# Patient Record
Sex: Male | Born: 1990 | Race: White | Hispanic: No | Marital: Married | Smoking: Former smoker
Health system: Southern US, Community
[De-identification: ages and names within clinical notes are randomized; demographics above are authoritative.]

## PROBLEM LIST (undated history)

## (undated) DIAGNOSIS — F988 Other specified behavioral and emotional disorders with onset usually occurring in childhood and adolescence: Secondary | ICD-10-CM

## (undated) DIAGNOSIS — M1990 Unspecified osteoarthritis, unspecified site: Secondary | ICD-10-CM

## (undated) DIAGNOSIS — G039 Meningitis, unspecified: Secondary | ICD-10-CM

## (undated) DIAGNOSIS — Z8489 Family history of other specified conditions: Secondary | ICD-10-CM

## (undated) DIAGNOSIS — K219 Gastro-esophageal reflux disease without esophagitis: Secondary | ICD-10-CM

## (undated) DIAGNOSIS — Z8614 Personal history of Methicillin resistant Staphylococcus aureus infection: Secondary | ICD-10-CM

## (undated) HISTORY — DX: Other specified behavioral and emotional disorders with onset usually occurring in childhood and adolescence: F98.8

## (undated) HISTORY — PX: TONSILLECTOMY: SUR1361

## (undated) HISTORY — DX: Meningitis, unspecified: G03.9

---
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
## 2008-10-09 ENCOUNTER — Telehealth (INDEPENDENT_AMBULATORY_CARE_PROVIDER_SITE_OTHER): Payer: Self-pay | Admitting: *Deleted

## 2008-10-09 ENCOUNTER — Ambulatory Visit: Payer: Self-pay | Admitting: Family Medicine

## 2008-10-09 DIAGNOSIS — F988 Other specified behavioral and emotional disorders with onset usually occurring in childhood and adolescence: Secondary | ICD-10-CM | POA: Insufficient documentation

## 2008-10-26 LAB — CONVERTED CEMR LAB
ALT: 35 units/L (ref 0–53)
AST: 23 units/L (ref 0–37)
Albumin: 4 g/dL (ref 3.5–5.2)
Alkaline Phosphatase: 104 units/L (ref 39–117)
BUN: 14 mg/dL (ref 6–23)
Bilirubin, Direct: 0.1 mg/dL (ref 0.0–0.3)
CO2: 28 meq/L (ref 19–32)
Chloride: 106 meq/L (ref 96–112)
Cholesterol: 158 mg/dL (ref 0–200)
Eosinophils Relative: 4.7 % (ref 0.0–5.0)
GFR calc Af Amer: 160 mL/min
Glucose, Bld: 96 mg/dL (ref 70–99)
HCT: 44.9 % (ref 39.0–52.0)
Lymphocytes Relative: 26.6 % (ref 12.0–46.0)
Monocytes Absolute: 0.7 10*3/uL (ref 0.1–1.0)
Monocytes Relative: 9.1 % (ref 3.0–12.0)
Neutrophils Relative %: 59.5 % (ref 43.0–77.0)
Platelets: 218 10*3/uL (ref 150–400)
Potassium: 3.7 meq/L (ref 3.5–5.1)
RDW: 12.2 % (ref 11.5–14.6)
Sodium: 142 meq/L (ref 135–145)
Total CHOL/HDL Ratio: 5.9
Total Protein: 6.7 g/dL (ref 6.0–8.3)
Triglycerides: 211 mg/dL (ref 0–149)
VLDL: 42 mg/dL — ABNORMAL HIGH (ref 0–40)
WBC: 8.1 10*3/uL (ref 4.5–10.5)

## 2010-07-31 DIAGNOSIS — Z8614 Personal history of Methicillin resistant Staphylococcus aureus infection: Secondary | ICD-10-CM

## 2010-07-31 HISTORY — DX: Personal history of Methicillin resistant Staphylococcus aureus infection: Z86.14

## 2011-02-06 ENCOUNTER — Encounter: Payer: Self-pay | Admitting: Family Medicine

## 2011-02-07 ENCOUNTER — Ambulatory Visit (INDEPENDENT_AMBULATORY_CARE_PROVIDER_SITE_OTHER): Payer: PRIVATE HEALTH INSURANCE | Admitting: Family Medicine

## 2011-02-07 ENCOUNTER — Encounter: Payer: Self-pay | Admitting: Family Medicine

## 2011-02-07 VITALS — BP 126/76 | HR 76 | Temp 99.0°F | Wt 311.6 lb

## 2011-02-07 DIAGNOSIS — F988 Other specified behavioral and emotional disorders with onset usually occurring in childhood and adolescence: Secondary | ICD-10-CM

## 2011-02-07 MED ORDER — AMPHETAMINE-DEXTROAMPHETAMINE 10 MG PO TABS: 10.0000 mg | ORAL_TABLET | Freq: Two times a day (BID) | ORAL | Status: DC

## 2011-02-07 NOTE — Patient Instructions (Signed)
Attention Deficit-Hyperactivity Disorder ADHD Attention deficit-hyperactivity disorder (ADHD) is a problem with behavior issues based on the way the brain functions (neurobehavioral disorder). It is a common reason for behavior and academic problems in school. CAUSES The cause of ADHD is unknown in most cases. It may run in families. It sometimes can be associated with learning disabilities and other behavioral problems. SYMPTOMS There are three types of ADHD. Some of the symptoms include:  Inattentive   Gets bored or distracted easily   Loses or forgets things. Forgets to hand in homework.   Has trouble organizing or completing tasks.   Difficulty staying on task.   An inability to organize daily tasks and school work.   Leaving projects, chores and homework unfinished.   Trouble paying attention or responding to details. Careless mistakes.   Difficulty following directions. Often seems like is not listening.   Dislikes activities that require sustained attention (like chores or homework).   Hyperactive-impulsive   Feels like it is impossible to sit still or stay in a seat. Fidgeting with hands and feet.   Trouble waiting turn.   Talking too much or out of turn. Interruptive.   Speaks or acts impulsively   Aggressive, disruptive behavior   Constantly busy or on the go, noisy.   Combined   Has symptoms of both of the above.  Often children with ADHD feel discouraged about themselves and with school. They often perform well below their abilities in school. These symptoms can cause problems in home, school, and in relationships with peers. As children get older, the excess motor activities can calm down, but the problems with paying attention and staying organized persist. Most children do not outgrow ADHD but with good treatment can learn to cope with the symptoms. DIAGNOSIS When ADHD is suspected, the diagnosis should be made by professionals trained in ADHD.    Diagnosis will include:  Ruling out other reasons for the child's behavior.   The caregivers will check with the child's school and check their medical records.   They will talk to teachers and parents.   Behavior rating scales for the child will be filled out by those dealing with the child on a daily basis.  A diagnosis is made only after all information has been considered. TREATMENT Treatment usually includes behavioral treatment often along with medicines. It may include stimulant medicines. The stimulant medicines decrease impulsivity and hyperactivity and increase attention. Other medicines used include antidepressants and certain blood pressure medicines. Most experts agree that treatment for ADHD should address all aspects of the child's functioning. Treatment should not be limited to the use of medicines alone. Treatment should include structured classroom management. The parents must receive education to address rewarding good behavior, discipline and limit-setting. Tutoring and/or behavioral therapy should be available for the child. If untreated, the disorder can have long term serious effects into adolescence and adulthood. HOMECARE INSTRUCTIONS   Often with ADHD there is a lot of frustration among the family in dealing with the illness. There is often blame and anger that is not warranted. This is a life long illness. There is no way to prevent ADHD. In many cases, because the problem affects the family as a whole, the entire family may need help. A therapist can help the family find better ways to handle the disruptive behaviors and promote change. If the child is young, most of the therapist's work is with the parents. Parents will learn techniques for coping with and improving their child's behavior.   Sometimes only the child with the ADHD needs counseling. Your caregivers can help you make these decisions.   Children with ADHD may need help in organizing. Here are some helpful  tips:   Keep routines the same every day from wake-up time to bedtime. Schedule everything. This includes homework and playtime. This should include outdoor and indoor recreation. Keep the schedule on the refrigerator or a bulletin board where it is frequently seen. Mark schedule changes as far in advance as possible.   Have a place for everything and keep everything in its place. This includes clothing, backpacks, and school supplies.   Encourage writing down assignments and bringing home needed books.   Offer your child a well-balanced diet. Breakfast is especially important for school performance. Children should avoid drinks with caffeine including:   Soft drinks.   Coffee.   Tea.   However, some older children (adolescents) may find these drinks helpful in improving their attention.   Children with ADHD need consistent rules that they can understand and follow. If rules are followed, give small rewards. Children with ADHD often receive, and expect, criticism. Look for good behavior and praise it. Set realistic goals. Give clear instructions. Look for activities that can foster success and self-esteem. Make time for pleasant activities with your child. Give lots of affection.   Parents are their children's greatest advocates. Learn as much as possible about ADHD. This helps you become a stronger and better advocate for your child. It also helps you educate your child's teachers and instructors if they feel inadequate in these areas. Parent support groups are often helpful. A national group with local chapters is called CHADD (Children and Adults with Attention Deficit/Hyperactivity Disorder).  PROGNOSIS  There is no cure for ADHD. Children with the disorder seldom outgrow it. Many find adaptive ways to accommodate the ADHD as they mature. SEEK MEDICAL CARE IF YOUR CHILD HAS:  Repeated muscle twitches, cough or speech outbursts.   Sleep problems.   Marked loss of appetite.    Depression.   New or worsening behavioral problems.   Dizziness.   Racing heart.   Stomach pains.   Headaches.  Document Released: 07/07/2002 Document Re-Released: 04/25/2008 [employer] Patient Information 2011 [employer]. 

## 2011-02-07 NOTE — Progress Notes (Signed)
  Subjective:    Patient ID: Nicholas Cook, male    DOB: 06-14-1991, 20 y.o.   MRN: [id number]  HPI  Pt here for ADD f/u.  No complaints--doing very well in school with the adderall.     Review of Systems As above    Objective:   Physical Exam  Constitutional: He appears well-developed and well-nourished.  Cardiovascular: Normal rate, regular rhythm, normal heart sounds and intact distal pulses.   Pulmonary/Chest: Effort normal and breath sounds normal.  Psychiatric: He has a normal mood and affect. His behavior is normal. Judgment and thought content normal.          Assessment & Plan:

## 2011-02-07 NOTE — Assessment & Plan Note (Signed)
Refill meds for 3 months rto 6 months

## 2011-03-09 ENCOUNTER — Other Ambulatory Visit: Payer: Self-pay | Admitting: Family Medicine

## 2011-03-10 NOTE — Telephone Encounter (Signed)
Spoke with pharmacy awaiting fax.

## 2011-03-17 NOTE — Telephone Encounter (Signed)
Several attempts have been made to contact number for prior auth #'s given requires you to leave a msg a message has been left twice for return call. [phone]

## 2011-03-27 NOTE — Telephone Encounter (Signed)
Spoke with PA agent who states we should receive call back within 48 hours.  Nicholas Cook and [employer] [phone]

## 2011-05-05 ENCOUNTER — Other Ambulatory Visit: Payer: Self-pay

## 2011-05-05 DIAGNOSIS — F988 Other specified behavioral and emotional disorders with onset usually occurring in childhood and adolescence: Secondary | ICD-10-CM

## 2011-05-05 MED ORDER — AMPHETAMINE-DEXTROAMPHETAMINE 10 MG PO TABS: 10.0000 mg | ORAL_TABLET | Freq: Two times a day (BID) | ORAL | Status: DC

## 2011-05-05 NOTE — Telephone Encounter (Signed)
Call from mother Nicholas Cook for refill request on Adderall--- VM left advising Gaylyn Rong that Rx ready for pick up     KP

## 2011-05-15 NOTE — Telephone Encounter (Signed)
Spoke with PA agent who states that form has yet to be receive. Form re-faxed awaiting status.

## 2011-05-22 NOTE — Telephone Encounter (Deleted)
Prior auth Approved 03-29-11 until 07-31-11.       

## 2011-05-22 NOTE — Telephone Encounter (Signed)
Prior auth Approved 03-29-11 until 07-31-11.

## 2011-08-03 ENCOUNTER — Telehealth: Payer: Self-pay

## 2011-08-03 NOTE — Telephone Encounter (Signed)
Called for PA and  Was told the patient's insurance was expired and this is why he does not have coverage. I called patient to discuss and he stated he made pharmacy aware and there were not supposed to send the PA information to [place]. He stated he will be paying for his med out of pocket and he is getting a discount.    KP

## 2011-08-14 ENCOUNTER — Other Ambulatory Visit: Payer: Self-pay

## 2011-08-14 DIAGNOSIS — F988 Other specified behavioral and emotional disorders with onset usually occurring in childhood and adolescence: Secondary | ICD-10-CM

## 2011-08-14 MED ORDER — AMPHETAMINE-DEXTROAMPHETAMINE 10 MG PO TABS: 10.0000 mg | ORAL_TABLET | Freq: Two times a day (BID) | ORAL | Status: DC

## 2011-08-14 NOTE — Telephone Encounter (Signed)
Rx left at check in.      KP 

## 2011-09-25 ENCOUNTER — Other Ambulatory Visit: Payer: Self-pay

## 2011-09-25 DIAGNOSIS — F988 Other specified behavioral and emotional disorders with onset usually occurring in childhood and adolescence: Secondary | ICD-10-CM

## 2011-09-25 MED ORDER — AMPHETAMINE-DEXTROAMPHETAMINE 10 MG PO TABS: 10.0000 mg | ORAL_TABLET | Freq: Two times a day (BID) | ORAL | Status: DC

## 2011-09-25 NOTE — Telephone Encounter (Signed)
Request for a 3 mo supply-    KP

## 2012-02-27 ENCOUNTER — Ambulatory Visit (INDEPENDENT_AMBULATORY_CARE_PROVIDER_SITE_OTHER): Payer: No Typology Code available for payment source | Admitting: Family Medicine

## 2012-02-27 ENCOUNTER — Encounter: Payer: Self-pay | Admitting: Family Medicine

## 2012-02-27 VITALS — BP 140/70 | HR 85 | Temp 98.8°F | Wt 265.4 lb

## 2012-02-27 DIAGNOSIS — F988 Other specified behavioral and emotional disorders with onset usually occurring in childhood and adolescence: Secondary | ICD-10-CM

## 2012-02-27 MED ORDER — AMPHETAMINE-DEXTROAMPHETAMINE 10 MG PO TABS: 10.0000 mg | ORAL_TABLET | Freq: Two times a day (BID) | ORAL | Status: DC

## 2012-02-27 NOTE — Assessment & Plan Note (Signed)
Refill meds rto 6 months Pt had cpe with sher[facility] dept

## 2012-02-27 NOTE — Patient Instructions (Signed)
Preventive Care for Adults, Male A healthy lifestyle and preventive care can promote health and wellness. Preventive health guidelines for women include the following key practices.  A routine yearly physical is a good way to check with your caregiver about your health and preventive screening. It is a chance to share any concerns and updates on your health, and to receive a thorough exam.   Visit your dentist for a routine exam and preventive care every 6 months. Brush your teeth twice a day and floss once a day. Good oral hygiene prevents tooth decay and gum disease.   The frequency of eye exams is based on your age, health, family medical history, use of contact lenses, and other factors. Follow your caregiver's recommendations for frequency of eye exams.   Eat a healthy diet. Foods like vegetables, fruits, whole grains, low-fat dairy products, and lean protein foods contain the nutrients you need without too many calories. Decrease your intake of foods high in solid fats, added sugars, and salt. Eat the right amount of calories for you.Get information about a proper diet from your caregiver, if necessary.   Regular physical exercise is one of the most important things you can do for your health. Most adults should get at least 150 minutes of moderate-intensity exercise (any activity that increases your heart rate and causes you to sweat) each week. In addition, most adults need muscle-strengthening exercises on 2 or more days a week.   Maintain a healthy weight. The body mass index (BMI) is a screening tool to identify possible weight problems. It provides an estimate of body fat based on height and weight. Your caregiver can help determine your BMI, and can help you achieve or maintain a healthy weight.For adults 20 years and older:   A BMI below 18.5 is considered underweight.   A BMI of 18.5 to 24.9 is normal.   A BMI of 25 to 29.9 is considered overweight.   A BMI of 30 and above is  considered obese.   Maintain normal blood lipids and cholesterol levels by exercising and minimizing your intake of saturated fat. Eat a balanced diet with plenty of fruit and vegetables. Blood tests for lipids and cholesterol should begin at age 20 and be repeated every 5 years. If your lipid or cholesterol levels are high, you are over 50, or you are at high risk for heart disease, you may need your cholesterol levels checked more frequently.Ongoing high lipid and cholesterol levels should be treated with medicines if diet and exercise are not effective.   If you smoke, find out from your caregiver how to quit. If you do not use tobacco, do not start.   If you are pregnant, do not drink alcohol. If you are breastfeeding, be very cautious about drinking alcohol. If you are not pregnant and choose to drink alcohol, do not exceed 1 drink per day. One drink is considered to be 12 ounces (355 mL) of beer, 5 ounces (148 mL) of wine, or 1.5 ounces (44 mL) of liquor.   Avoid use of street drugs. Do not share needles with anyone. Ask for help if you need support or instructions about stopping the use of drugs.   High blood pressure causes heart disease and increases the risk of stroke. Your blood pressure should be checked at least every 1 to 2 years. Ongoing high blood pressure should be treated with medicines if weight loss and exercise are not effective.   If you are 55 to 23   years old, ask your caregiver if you should take aspirin to prevent strokes.   Diabetes screening involves taking a blood sample to check your fasting blood sugar level. This should be done once every 3 years, after age 45, if you are within normal weight and without risk factors for diabetes. Testing should be considered at a younger age or be carried out more frequently if you are overweight and have at least 1 risk factor for diabetes.   Breast cancer screening is essential preventive care for women. You should practice "breast  self-awareness." This means understanding the normal appearance and feel of your breasts and may include breast self-examination. Any changes detected, no matter how small, should be reported to a caregiver. Women in their 20s and 30s should have a clinical breast exam (CBE) by a caregiver as part of a regular health exam every 1 to 3 years. After age 40, women should have a CBE every year. Starting at age 40, women should consider having a mammography (breast X-ray test) every year. Women who have a family history of breast cancer should talk to their caregiver about genetic screening. Women at a high risk of breast cancer should talk to their caregivers about having magnetic resonance imaging (MRI) and a mammography every year.   The Pap test is a screening test for cervical cancer. A Pap test can show cell changes on the cervix that might become cervical cancer if left untreated. A Pap test is a procedure in which cells are obtained and examined from the lower end of the uterus (cervix).   Women should have a Pap test starting at age 21.   Between ages 21 and 29, Pap tests should be repeated every 2 years.   Beginning at age 30, you should have a Pap test every 3 years as long as the past 3 Pap tests have been normal.   Some women have medical problems that increase the chance of getting cervical cancer. Talk to your caregiver about these problems. It is especially important to talk to your caregiver if a new problem develops soon after your last Pap test. In these cases, your caregiver may recommend more frequent screening and Pap tests.   The above recommendations are the same for women who have or have not gotten the vaccine for human papillomavirus (HPV).   If you had a hysterectomy for a problem that was not cancer or a condition that could lead to cancer, then you no longer need Pap tests. Even if you no longer need a Pap test, a regular exam is a good idea to make sure no other problems are  starting.   If you are between ages 65 and 70, and you have had normal Pap tests going back 10 years, you no longer need Pap tests. Even if you no longer need a Pap test, a regular exam is a good idea to make sure no other problems are starting.   If you have had past treatment for cervical cancer or a condition that could lead to cancer, you need Pap tests and screening for cancer for at least 20 years after your treatment.   If Pap tests have been discontinued, risk factors (such as a new sexual partner) need to be reassessed to determine if screening should be resumed.   The HPV test is an additional test that may be used for cervical cancer screening. The HPV test looks for the virus that can cause the cell changes on the cervix.   The cells collected during the Pap test can be tested for HPV. The HPV test could be used to screen women aged 30 years and older, and should be used in women of any age who have unclear Pap test results. After the age of 30, women should have HPV testing at the same frequency as a Pap test.   Colorectal cancer can be detected and often prevented. Most routine colorectal cancer screening begins at the age of 50 and continues through age 75. However, your caregiver may recommend screening at an earlier age if you have risk factors for colon cancer. On a yearly basis, your caregiver may provide home test kits to check for hidden blood in the stool. Use of a small camera at the end of a tube, to directly examine the colon (sigmoidoscopy or colonoscopy), can detect the earliest forms of colorectal cancer. Talk to your caregiver about this at age 50, when routine screening begins. Direct examination of the colon should be repeated every 5 to 10 years through age 75, unless early forms of pre-cancerous polyps or small growths are found.   Hepatitis C blood testing is recommended for all people born from 1945 through 1965 and any individual with known risks for hepatitis C.    Practice safe sex. Use condoms and avoid high-risk sexual practices to reduce the spread of sexually transmitted infections (STIs). STIs include gonorrhea, chlamydia, syphilis, trichomonas, herpes, HPV, and human immunodeficiency virus (HIV). Herpes, HIV, and HPV are viral illnesses that have no cure. They can result in disability, cancer, and death. Sexually active women aged 25 and younger should be checked for chlamydia. Older women with new or multiple partners should also be tested for chlamydia. Testing for other STIs is recommended if you are sexually active and at increased risk.   Osteoporosis is a disease in which the bones lose minerals and strength with aging. This can result in serious bone fractures. The risk of osteoporosis can be identified using a bone density scan. Women ages 65 and over and women at risk for fractures or osteoporosis should discuss screening with their caregivers. Ask your caregiver whether you should take a calcium supplement or vitamin D to reduce the rate of osteoporosis.   Menopause can be associated with physical symptoms and risks. Hormone replacement therapy is available to decrease symptoms and risks. You should talk to your caregiver about whether hormone replacement therapy is right for you.   Use sunscreen with sun protection factor (SPF) of 30 or more. Apply sunscreen liberally and repeatedly throughout the day. You should seek shade when your shadow is shorter than you. Protect yourself by wearing long sleeves, pants, a wide-brimmed hat, and sunglasses year round, whenever you are outdoors.   Once a month, do a whole body skin exam, using a mirror to look at the skin on your back. Notify your caregiver of new moles, moles that have irregular borders, moles that are larger than a pencil eraser, or moles that have changed in shape or color.   Stay current with required immunizations.   Influenza. You need a dose every fall (or winter). The composition of  the flu vaccine changes each year, so being vaccinated once is not enough.   Pneumococcal polysaccharide. You need 1 to 2 doses if you smoke cigarettes or if you have certain chronic medical conditions. You need 1 dose at age 65 (or older) if you have never been vaccinated.   Tetanus, diphtheria, pertussis (Tdap, Td). Get 1 dose of   Tdap vaccine if you are younger than age 65, are over 65 and have contact with an infant, are a healthcare worker, are pregnant, or simply want to be protected from whooping cough. After that, you need a Td booster dose every 10 years. Consult your caregiver if you have not had at least 3 tetanus and diphtheria-containing shots sometime in your life or have a deep or dirty wound.   HPV. You need this vaccine if you are a woman age 26 or younger. The vaccine is given in 3 doses over 6 months.   Measles, mumps, rubella (MMR). You need at least 1 dose of MMR if you were born in 1957 or later. You may also need a second dose.   Meningococcal. If you are age 19 to 21 and a first-year college student living in a residence hall, or have one of several medical conditions, you need to get vaccinated against meningococcal disease. You may also need additional booster doses.   Zoster (shingles). If you are age 60 or older, you should get this vaccine.   Varicella (chickenpox). If you have never had chickenpox or you were vaccinated but received only 1 dose, talk to your caregiver to find out if you need this vaccine.   Hepatitis A. You need this vaccine if you have a specific risk factor for hepatitis A virus infection or you simply wish to be protected from this disease. The vaccine is usually given as 2 doses, 6 to 18 months apart.   Hepatitis B. You need this vaccine if you have a specific risk factor for hepatitis B virus infection or you simply wish to be protected from this disease. The vaccine is given in 3 doses, usually over 6 months.  Preventive Services /  Frequency Ages 19 to 39  Blood pressure check.** / Every 1 to 2 years.   Lipid and cholesterol check.** / Every 5 years beginning at age 20.   Clinical breast exam.** / Every 3 years for women in their 20s and 30s.   Pap test.** / Every 2 years from ages 21 through 29. Every 3 years starting at age 30 through age 65 or 70 with a history of 3 consecutive normal Pap tests.   HPV screening.** / Every 3 years from ages 30 through ages 65 to 70 with a history of 3 consecutive normal Pap tests.   Hepatitis C blood test.** / For any individual with known risks for hepatitis C.   Skin self-exam. / Monthly.   Influenza immunization.** / Every year.   Pneumococcal polysaccharide immunization.** / 1 to 2 doses if you smoke cigarettes or if you have certain chronic medical conditions.   Tetanus, diphtheria, pertussis (Tdap, Td) immunization. / A one-time dose of Tdap vaccine. After that, you need a Td booster dose every 10 years.   HPV immunization. / 3 doses over 6 months, if you are 26 and younger.   Measles, mumps, rubella (MMR) immunization. / You need at least 1 dose of MMR if you were born in 1957 or later. You may also need a second dose.   Meningococcal immunization. / 1 dose if you are age 19 to 21 and a first-year college student living in a residence hall, or have one of several medical conditions, you need to get vaccinated against meningococcal disease. You may also need additional booster doses.   Varicella immunization.** / Consult your caregiver.   Hepatitis A immunization.** / Consult your caregiver. 2 doses, 6 to 18 months   apart.   Hepatitis B immunization.** / Consult your caregiver. 3 doses usually over 6 months.  Ages 40 to 64  Blood pressure check.** / Every 1 to 2 years.   Lipid and cholesterol check.** / Every 5 years beginning at age 20.   Clinical breast exam.** / Every year after age 40.   Mammogram.** / Every year beginning at age 40 and continuing for as  long as you are in good health. Consult with your caregiver.   Pap test.** / Every 3 years starting at age 30 through age 65 or 70 with a history of 3 consecutive normal Pap tests.   HPV screening.** / Every 3 years from ages 30 through ages 65 to 70 with a history of 3 consecutive normal Pap tests.   Fecal occult blood test (FOBT) of stool. / Every year beginning at age 50 and continuing until age 75. You may not need to do this test if you get a colonoscopy every 10 years.   Flexible sigmoidoscopy or colonoscopy.** / Every 5 years for a flexible sigmoidoscopy or every 10 years for a colonoscopy beginning at age 50 and continuing until age 75.   Hepatitis C blood test.** / For all people born from 1945 through 1965 and any individual with known risks for hepatitis C.   Skin self-exam. / Monthly.   Influenza immunization.** / Every year.   Pneumococcal polysaccharide immunization.** / 1 to 2 doses if you smoke cigarettes or if you have certain chronic medical conditions.   Tetanus, diphtheria, pertussis (Tdap, Td) immunization.** / A one-time dose of Tdap vaccine. After that, you need a Td booster dose every 10 years.   Measles, mumps, rubella (MMR) immunization. / You need at least 1 dose of MMR if you were born in 1957 or later. You may also need a second dose.   Varicella immunization.** / Consult your caregiver.   Meningococcal immunization.** / Consult your caregiver.   Hepatitis A immunization.** / Consult your caregiver. 2 doses, 6 to 18 months apart.   Hepatitis B immunization.** / Consult your caregiver. 3 doses, usually over 6 months.  Ages 65 and over  Blood pressure check.** / Every 1 to 2 years.   Lipid and cholesterol check.** / Every 5 years beginning at age 20.   Clinical breast exam.** / Every year after age 40.   Mammogram.** / Every year beginning at age 40 and continuing for as long as you are in good health. Consult with your caregiver.   Pap test.** /  Every 3 years starting at age 30 through age 65 or 70 with a 3 consecutive normal Pap tests. Testing can be stopped between 65 and 70 with 3 consecutive normal Pap tests and no abnormal Pap or HPV tests in the past 10 years.   HPV screening.** / Every 3 years from ages 30 through ages 65 or 70 with a history of 3 consecutive normal Pap tests. Testing can be stopped between 65 and 70 with 3 consecutive normal Pap tests and no abnormal Pap or HPV tests in the past 10 years.   Fecal occult blood test (FOBT) of stool. / Every year beginning at age 50 and continuing until age 75. You may not need to do this test if you get a colonoscopy every 10 years.   Flexible sigmoidoscopy or colonoscopy.** / Every 5 years for a flexible sigmoidoscopy or every 10 years for a colonoscopy beginning at age 50 and continuing until age 75.   Hepatitis   C blood test.** / For all people born from 1945 through 1965 and any individual with known risks for hepatitis C.   Osteoporosis screening.** / A one-time screening for women ages 65 and over and women at risk for fractures or osteoporosis.   Skin self-exam. / Monthly.   Influenza immunization.** / Every year.   Pneumococcal polysaccharide immunization.** / 1 dose at age 65 (or older) if you have never been vaccinated.   Tetanus, diphtheria, pertussis (Tdap, Td) immunization. / A one-time dose of Tdap vaccine if you are over 65 and have contact with an infant, are a healthcare worker, or simply want to be protected from whooping cough. After that, you need a Td booster dose every 10 years.   Varicella immunization.** / Consult your caregiver.   Meningococcal immunization.** / Consult your caregiver.   Hepatitis A immunization.** / Consult your caregiver. 2 doses, 6 to 18 months apart.   Hepatitis B immunization.** / Check with your caregiver. 3 doses, usually over 6 months.  ** Family history and personal history of risk and conditions may change your caregiver's  recommendations. Document Released: 09/12/2001 Document Revised: 07/06/2011 Document Reviewed: 12/12/2010 [employer] Patient Information 2012 [employer]. 

## 2012-02-27 NOTE — Progress Notes (Signed)
  Subjective:    Patient ID: Nicholas Cook, male    DOB: August 28, 1990, 21 y.o.   MRN: [id number]  HPI  Pt here for f/u adderall.  Pt had to drop out of college secondary to finances but now works for [employer].     Review of Systems As above    Objective:   Physical Exam  Constitutional: He is oriented to person, place, and time. He appears well-developed and well-nourished.  Cardiovascular: Normal rate, regular rhythm and normal heart sounds.   Pulmonary/Chest: Effort normal and breath sounds normal. No respiratory distress. He has no wheezes. He has no rales. He exhibits no tenderness.  Neurological: He is alert and oriented to person, place, and time.  Psychiatric: He has a normal mood and affect. His behavior is normal. Judgment and thought content normal.          Assessment & Plan:

## 2012-03-04 ENCOUNTER — Other Ambulatory Visit: Payer: Self-pay

## 2012-03-04 NOTE — Telephone Encounter (Signed)
I called [phone] and requested prior auth paperwork to be faxed. Once paperwork received it will be completed by Triage Tresa Moore)

## 2012-03-05 NOTE — Telephone Encounter (Signed)
Paper work still not received by completion of office day noted 03-04-12, placed forms noted pending faxed information to be completed by Candie Echevaria

## 2012-03-06 NOTE — Telephone Encounter (Signed)
Called to check status of fax form per representative form was faxed on 03-04-12 @ 1:38 to [phone]. Advised rep this was incorrect fax number and provided with correct #, awaiting fax.

## 2012-03-11 NOTE — Telephone Encounter (Signed)
PA received and faxed awaiting response.

## 2012-04-04 NOTE — Telephone Encounter (Signed)
Prior Auth 03-11-12 until 03-11-13, pharmacy faxed.

## 2012-07-02 ENCOUNTER — Other Ambulatory Visit: Payer: Self-pay

## 2012-07-02 DIAGNOSIS — F988 Other specified behavioral and emotional disorders with onset usually occurring in childhood and adolescence: Secondary | ICD-10-CM

## 2012-07-02 MED ORDER — AMPHETAMINE-DEXTROAMPHETAMINE 10 MG PO TABS: 10.0000 mg | ORAL_TABLET | Freq: Two times a day (BID) | ORAL | Status: DC

## 2012-07-02 NOTE — Telephone Encounter (Signed)
Patent aware Rx ready for pick up.      KP 

## 2012-08-12 ENCOUNTER — Other Ambulatory Visit: Payer: Self-pay

## 2012-08-12 DIAGNOSIS — F988 Other specified behavioral and emotional disorders with onset usually occurring in childhood and adolescence: Secondary | ICD-10-CM

## 2012-08-12 NOTE — Telephone Encounter (Signed)
Rf request for Adderall. I made mother aware patient would need to call in to complete paperwork

## 2012-08-13 MED ORDER — AMPHETAMINE-DEXTROAMPHETAMINE 10 MG PO TABS: 10.0000 mg | ORAL_TABLET | Freq: Two times a day (BID) | ORAL | Status: DC

## 2012-08-13 NOTE — Telephone Encounter (Signed)
Msg left advising Med's ready for pick up.       KP

## 2012-08-21 ENCOUNTER — Encounter: Payer: Self-pay | Admitting: Lab

## 2012-08-22 ENCOUNTER — Encounter: Payer: Self-pay | Admitting: Family Medicine

## 2012-08-22 ENCOUNTER — Ambulatory Visit (INDEPENDENT_AMBULATORY_CARE_PROVIDER_SITE_OTHER): Payer: No Typology Code available for payment source | Admitting: Family Medicine

## 2012-08-22 VITALS — BP 112/70 | HR 89 | Temp 98.9°F | Wt 283.2 lb

## 2012-08-22 DIAGNOSIS — F988 Other specified behavioral and emotional disorders with onset usually occurring in childhood and adolescence: Secondary | ICD-10-CM

## 2012-08-22 MED ORDER — AMPHETAMINE-DEXTROAMPHETAMINE 10 MG PO TABS: 10.0000 mg | ORAL_TABLET | Freq: Two times a day (BID) | ORAL | Status: DC

## 2012-08-22 NOTE — Patient Instructions (Addendum)
Attention Deficit Hyperactivity Disorder Attention deficit hyperactivity disorder (ADHD) is a problem with behavior issues based on the way the brain functions (neurobehavioral disorder). It is a common reason for behavior and academic problems in school. CAUSES  The cause of ADHD is unknown in most cases. It may run in families. It sometimes can be associated with learning disabilities and other behavioral problems. SYMPTOMS  There are 3 types of ADHD. The 3 types and some of the symptoms include:  Inattentive  Gets bored or distracted easily.  Loses or forgets things. Forgets to hand in homework.  Has trouble organizing or completing tasks.  Difficulty staying on task.  An inability to organize daily tasks and school work.  Leaving projects, chores, or homework unfinished.  Trouble paying attention or responding to details. Careless mistakes.  Difficulty following directions. Often seems like is not listening.  Dislikes activities that require sustained attention (like chores or homework).  Hyperactive-impulsive  Feels like it is impossible to sit still or stay in a seat. Fidgeting with hands and feet.  Trouble waiting turn.  Talking too much or out of turn. Interruptive.  Speaks or acts impulsively.  Aggressive, disruptive behavior.  Constantly busy or on the go, noisy.  Combined  Has symptoms of both of the above. Often children with ADHD feel discouraged about themselves and with school. They often perform well below their abilities in school. These symptoms can cause problems in home, school, and in relationships with peers. As children get older, the excess motor activities can calm down, but the problems with paying attention and staying organized persist. Most children do not outgrow ADHD but with good treatment can learn to cope with the symptoms. DIAGNOSIS  When ADHD is suspected, the diagnosis should be made by professionals trained in ADHD.  Diagnosis will  include:  Ruling out other reasons for the child's behavior.  The caregivers will check with the child's school and check their medical records.  They will talk to teachers and parents.  Behavior rating scales for the child will be filled out by those dealing with the child on a daily basis. A diagnosis is made only after all information has been considered. TREATMENT  Treatment usually includes behavioral treatment often along with medicines. It may include stimulant medicines. The stimulant medicines decrease impulsivity and hyperactivity and increase attention. Other medicines used include antidepressants and certain blood pressure medicines. Most experts agree that treatment for ADHD should address all aspects of the child's functioning. Treatment should not be limited to the use of medicines alone. Treatment should include structured classroom management. The parents must receive education to address rewarding good behavior, discipline, and limit-setting. Tutoring or behavioral therapy or both should be available for the child. If untreated, the disorder can have long-term serious effects into adolescence and adulthood. HOME CARE INSTRUCTIONS   Often with ADHD there is a lot of frustration among the family in dealing with the illness. There is often blame and anger that is not warranted. This is a life long illness. There is no way to prevent ADHD. In many cases, because the problem affects the family as a whole, the entire family may need help. A therapist can help the family find better ways to handle the disruptive behaviors and promote change. If the child is young, most of the therapist's work is with the parents. Parents will learn techniques for coping with and improving their child's behavior. Sometimes only the child with the ADHD needs counseling. Your caregivers can help   you make these decisions.  Children with ADHD may need help in organizing. Some helpful tips include:  Keep  routines the same every day from wake-up time to bedtime. Schedule everything. This includes homework and playtime. This should include outdoor and indoor recreation. Keep the schedule on the refrigerator or a bulletin board where it is frequently seen. Mark schedule changes as far in advance as possible.  Have a place for everything and keep everything in its place. This includes clothing, backpacks, and school supplies.  Encourage writing down assignments and bringing home needed books.  Offer your child a well-balanced diet. Breakfast is especially important for school performance. Children should avoid drinks with caffeine including:  Soft drinks.  Coffee.  Tea.  However, some older children (adolescents) may find these drinks helpful in improving their attention.  Children with ADHD need consistent rules that they can understand and follow. If rules are followed, give small rewards. Children with ADHD often receive, and expect, criticism. Look for good behavior and praise it. Set realistic goals. Give clear instructions. Look for activities that can foster success and self-esteem. Make time for pleasant activities with your child. Give lots of affection.  Parents are their children's greatest advocates. Learn as much as possible about ADHD. This helps you become a stronger and better advocate for your child. It also helps you educate your child's teachers and instructors if they feel inadequate in these areas. Parent support groups are often helpful. A national group with local chapters is called CHADD (Children and Adults with Attention Deficit Hyperactivity Disorder). PROGNOSIS  There is no cure for ADHD. Children with the disorder seldom outgrow it. Many find adaptive ways to accommodate the ADHD as they mature. SEEK MEDICAL CARE IF:  Your child has repeated muscle twitches, cough or speech outbursts.  Your child has sleep problems.  Your child has a marked loss of  appetite.  Your child develops depression.  Your child has new or worsening behavioral problems.  Your child develops dizziness.  Your child has a racing heart.  Your child has stomach pains.  Your child develops headaches. Document Released: 07/07/2002 Document Revised: 10/09/2011 Document Reviewed: 02/17/2008 ExitCare Patient Information 2013 [employer].  

## 2012-08-22 NOTE — Assessment & Plan Note (Signed)
Cont meds rto 6 months

## 2012-08-22 NOTE — Progress Notes (Signed)
  Subjective:    Patient ID: Nicholas Cook, male    DOB: Nov 15, 1990, 22 y.o.   MRN: [id number]  HPI Pt here for f/u add.  Pt is doing well with med and is enjoying his job with [employer].   No complaints.   Review of Systems As above    Objective:   Physical Exam  BP 112/70  Pulse 89  Temp 98.9 F (37.2 C) (Oral)  Wt 283 lb 3.2 oz (128.459 kg)  SpO2 97% General appearance: alert, cooperative, appears stated age and no distress Lungs: clear to auscultation bilaterally Heart: S1, S2 normal      Assessment & Plan:

## 2012-09-25 ENCOUNTER — Encounter: Payer: Self-pay | Admitting: Family Medicine

## 2012-11-18 ENCOUNTER — Other Ambulatory Visit: Payer: Self-pay

## 2012-11-18 DIAGNOSIS — F988 Other specified behavioral and emotional disorders with onset usually occurring in childhood and adolescence: Secondary | ICD-10-CM

## 2012-11-18 MED ORDER — AMPHETAMINE-DEXTROAMPHETAMINE 10 MG PO TABS: 10.0000 mg | ORAL_TABLET | Freq: Two times a day (BID) | ORAL | Status: DC

## 2012-11-18 NOTE — Telephone Encounter (Signed)
Patient mother called for refill request. Patient aware Rx will be ready tomorrow for pick up.      KP

## 2012-11-21 ENCOUNTER — Telehealth: Payer: Self-pay | Admitting: *Deleted

## 2012-11-21 NOTE — Telephone Encounter (Signed)
Mother came to the office to pick up Adderall prescriptions. Made mom aware that she was not on the pts DPR and that he would need to call the office to have her added to that. Mom states that she always picks up his medications for him, verified through Selena Batten that mom always pick ups scripts as pt lives/works out of town. Released scripts to mom, she is to have the patient call the office prior to next refill.

## 2013-02-25 ENCOUNTER — Telehealth: Payer: Self-pay | Admitting: Family Medicine

## 2013-02-25 NOTE — Telephone Encounter (Signed)
Patient asks that we send over something to [facility] stating that it is okay for them to write his Aderrall prescriptions. Patient says he lives and works in [facility] [place] and it is more convenient for him to have them print out his prescriptions than having to drive 45 minutes to our office. Patient says that [facility] is okay with writing the prescriptions they just need proof that he is on the medication.  Fax#: [phone]

## 2013-02-26 NOTE — Telephone Encounter (Signed)
If pt wants someone else to write scripts and they're ok w/ writing them- that would be great!  Can fax them current med list and problem list if pt gives permission to do so

## 2013-02-26 NOTE — Telephone Encounter (Signed)
Is this okay to do, we can put a note in the [facility] banner that pt is getting scripts from [facility]. Pt can be made aware that if he gets scripts from [facility], we will not be writing them.

## 2013-03-03 NOTE — Telephone Encounter (Signed)
Faxed ROI to [facility].

## 2013-03-03 NOTE — Telephone Encounter (Signed)
Signed ROI received from [facility]. Copy of medication list, problem list and signed ROI faxed to Catha Brow at [phone]

## 2014-03-02 ENCOUNTER — Emergency Department: Payer: Self-pay | Admitting: Emergency Medicine

## 2015-05-21 ENCOUNTER — Telehealth: Payer: Self-pay | Admitting: Physician Assistant

## 2015-05-21 DIAGNOSIS — F988 Other specified behavioral and emotional disorders with onset usually occurring in childhood and adolescence: Secondary | ICD-10-CM

## 2015-05-21 MED ORDER — AMPHETAMINE-DEXTROAMPHETAMINE 10 MG PO TABS: ORAL_TABLET | ORAL | Status: DC

## 2015-05-21 NOTE — Telephone Encounter (Signed)
duplicate

## 2015-05-21 NOTE — Telephone Encounter (Signed)
Needs med refill, adderall , doing ok on dose, last refill was on 04/19/15, pt states he knows to keep meds in safe and secure place

## 2015-06-21 ENCOUNTER — Ambulatory Visit: Payer: Self-pay | Admitting: Physician Assistant

## 2015-06-21 ENCOUNTER — Encounter: Payer: Self-pay | Admitting: Physician Assistant

## 2015-06-21 VITALS — BP 140/80 | HR 83 | Temp 98.8°F

## 2015-06-21 DIAGNOSIS — F988 Other specified behavioral and emotional disorders with onset usually occurring in childhood and adolescence: Secondary | ICD-10-CM

## 2015-06-21 MED ORDER — AMPHETAMINE-DEXTROAMPHETAMINE 10 MG PO TABS: ORAL_TABLET | ORAL | Status: DC

## 2015-06-21 NOTE — Progress Notes (Signed)
Here for med refill of adderall, no problems with medication, knows to keep meds in safe and secure place, understands abuse potential of medication

## 2015-07-06 ENCOUNTER — Ambulatory Visit: Payer: Self-pay | Admitting: Physician Assistant

## 2015-07-06 ENCOUNTER — Encounter: Payer: Self-pay | Admitting: Physician Assistant

## 2015-07-06 VITALS — BP 120/80 | HR 83 | Temp 98.4°F

## 2015-07-06 DIAGNOSIS — M25511 Pain in right shoulder: Secondary | ICD-10-CM

## 2015-07-06 MED ORDER — CYCLOBENZAPRINE HCL 10 MG PO TABS: 10.0000 mg | ORAL_TABLET | Freq: Three times a day (TID) | ORAL | Status: DC | PRN

## 2015-07-06 MED ORDER — METHYLPREDNISOLONE 4 MG PO TBPK: ORAL_TABLET | ORAL | Status: DC

## 2015-07-06 NOTE — Progress Notes (Signed)
S: c/o r shoulder pain, ? Injury while at the gym, pain is sharp when it happens, wakes him out of his sleep, was seen in acute care and had xrays done which were normal, no loss of motion, no numbness tingling, thinks he hurt it by having weight bar across his shoulder while doing squats  O: vitals wnl, nad, cspine nontender with full rom, r shoulder tender along supraspinatus and trapezious, can feel part of a muscle roll like its either torn or spasmed, has full rom, grips = b/l, n/v intact, lungs c t a, cv rrr  A: r shoulder pain with ?torn muscle vs spasm/strain  P: flexeril 10mg  tid, medrol dose pack, recheck next week, ice, wet heat prior to stretching, if still having pain will refer to sports med

## 2015-07-12 ENCOUNTER — Ambulatory Visit: Payer: Self-pay | Admitting: Physician Assistant

## 2015-07-12 ENCOUNTER — Encounter: Payer: Self-pay | Admitting: Physician Assistant

## 2015-07-12 VITALS — BP 140/90 | HR 76 | Temp 98.4°F

## 2015-07-12 DIAGNOSIS — J029 Acute pharyngitis, unspecified: Secondary | ICD-10-CM

## 2015-07-12 NOTE — Progress Notes (Signed)
S: here for recheck of r shoulder, states its much better since taking steroid, muscle relaxer, no pain with movement, also sore throat today, just wants to make sure its not anything bad, denies fever/chills  O: vitals wnl, nad, throat irritated from pnd, neck supple no lymph, lungs c t a, cv rrr, shoulders nontender with full rom, n/v intact  A: sore throat, resolved shoulder pain  P: f/u prn, if sore throat worsening through the week will call in antibiotic

## 2015-07-19 ENCOUNTER — Ambulatory Visit: Payer: Self-pay | Admitting: Physician Assistant

## 2015-07-19 ENCOUNTER — Encounter: Payer: Self-pay | Admitting: Physician Assistant

## 2015-07-19 VITALS — BP 130/80 | HR 80

## 2015-07-19 DIAGNOSIS — F988 Other specified behavioral and emotional disorders with onset usually occurring in childhood and adolescence: Secondary | ICD-10-CM

## 2015-07-19 MED ORDER — AMPHETAMINE-DEXTROAMPHETAMINE 10 MG PO TABS: ORAL_TABLET | ORAL | Status: DC

## 2015-07-19 NOTE — Progress Notes (Signed)
S: here for BLET physical, no complaints, needs adderall rx  O: vitals wnl, nad, lungs c t a, cv rrr  A: adult add  P: adderall 10mg  2 in am and 1 in pm, #90 nr

## 2015-07-23 NOTE — Progress Notes (Signed)
Patient ID: Nicholas Cook, male   DOB: Dec 24, 1990, 24 y.o.   MRN: [id number] Per Susan's authorization I referred patient to see Dr. Hulan Saas at [facility] on [address] in [place] for 07/27/2015 at 2pm.

## 2015-07-27 ENCOUNTER — Ambulatory Visit (INDEPENDENT_AMBULATORY_CARE_PROVIDER_SITE_OTHER): Payer: No Typology Code available for payment source | Admitting: Family Medicine

## 2015-07-27 ENCOUNTER — Ambulatory Visit (INDEPENDENT_AMBULATORY_CARE_PROVIDER_SITE_OTHER)
Admission: RE | Admit: 2015-07-27 | Discharge: 2015-07-27 | Disposition: A | Discharge status: Home / Self Care | Payer: No Typology Code available for payment source | Source: Ambulatory Visit | Attending: Family Medicine | Admitting: Family Medicine

## 2015-07-27 ENCOUNTER — Encounter: Payer: Self-pay | Admitting: Family Medicine

## 2015-07-27 VITALS — BP 136/90 | HR 86 | Ht 77.0 in | Wt 275.0 lb

## 2015-07-27 DIAGNOSIS — G2589 Other specified extrapyramidal and movement disorders: Secondary | ICD-10-CM | POA: Diagnosis not present

## 2015-07-27 DIAGNOSIS — M9908 Segmental and somatic dysfunction of rib cage: Secondary | ICD-10-CM | POA: Diagnosis not present

## 2015-07-27 DIAGNOSIS — M5010 Cervical disc disorder with radiculopathy, unspecified cervical region: Secondary | ICD-10-CM

## 2015-07-27 DIAGNOSIS — M999 Biomechanical lesion, unspecified: Secondary | ICD-10-CM

## 2015-07-27 DIAGNOSIS — M9902 Segmental and somatic dysfunction of thoracic region: Secondary | ICD-10-CM | POA: Diagnosis not present

## 2015-07-27 DIAGNOSIS — M542 Cervicalgia: Secondary | ICD-10-CM

## 2015-07-27 DIAGNOSIS — M9901 Segmental and somatic dysfunction of cervical region: Secondary | ICD-10-CM | POA: Diagnosis not present

## 2015-07-27 NOTE — Patient Instructions (Signed)
Good to see you Ice 20 minutes 2 times daily. Usually after activity and before bed. Neck xrays today We did some manipulation today Vitamin D 2000IU daily  Turmeric 500mg  twice daily pennsaid pinkie amount topically 2 times daily as needed.  See me again in 3-4 weeks.  Happy New Year!

## 2015-07-27 NOTE — Assessment & Plan Note (Signed)
Decision today to treat with OMT was based on Physical Exam  After verbal consent patient was treated with HVLA, ME, FPR techniques in cervical, rim and thoracic areas  Patient tolerated the procedure well with improvement in symptoms  Patient given exercises, stretches and lifestyle modifications  See medications in patient instructions if given  Patient will follow up in 3-4 weeks

## 2015-07-27 NOTE — Assessment & Plan Note (Signed)
X-rays pending that I do think that this could be contribute in. Possible positive Spurling's. We discussed icing regimen and home exercises. Patient is some postural changes that I think will be beneficial. We discussed home and if any worsening symptoms such as radiation down the arm or any significant weakness that advance imaging would be warranted. Patient denies any severe significant signs or symptoms that would make me to advance imaging at this moment. If patient has failed all conservative therapy. We will consider. Patient come back and see me again in 3-4 weeks for further evaluation and treatment. At follow-up if continuing have pain we will look at patient shoulders and consider injection.

## 2015-07-27 NOTE — Progress Notes (Signed)
Pre visit review using our clinic review tool, if applicable. No additional management support is needed unless otherwise documented below in the visit note. 

## 2015-07-27 NOTE — Progress Notes (Signed)
[facility] [address] [place], [place] [address] Phone: [phone] Subjective:    I'm seeing this patient by the request  of:  No primary care provider on file. Ashok Cordia    CC: Bilateral shoulder and neck pain  [id number] Nicholas Cook is a 24 y.o. male coming in with complaint of bilateral shoulder and neck pain. Patient feels that when he was weightlifting he had significant amount of discomfort when he was doing a squat. Had some radiation down the right shoulder. States that this started to seem to resolve over the course of next several weeks. Denies any numbness but did have a discomfort or even a tingling sensation that still present on the posterior medial aspect of the scapula. Has noticed some may be weakness with some of his list. States now it seems to be going to the contralateral side. Describes it as a dull, throbbing sensation. No radiation down the arm but also having some mild weakness. More radiation towards the neck. Patient has tried some over-the-counter medications with mild improvement. States that he was on prednisone but didn't resolve the pain immediately for quite some time. States though it slowly came back when he is discontinue the medication. 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  Past Medical History  Diagnosis Date  . Meningitis   . ADD (attention deficit disorder)    Past Surgical History  Procedure Laterality Date  . Tonsillectomy     Social History  Substance Use Topics  . Smoking status: Never Smoker   . Smokeless tobacco: Current User  . Alcohol Use: No   Allergies  Allergen Reactions  . Penicillin G Other (See Comments)    Pt. Reports Mom told him he was allergic.   Family History  Problem Relation Age of Onset  . Hyperlipidemia Mother   . Hypertension Mother         Past  medical history, social, surgical and family history all reviewed in electronic medical record.   Review of Systems: No headache, visual changes, nausea, vomiting, diarrhea, constipation, dizziness, abdominal pain, skin rash, fevers, chills, night sweats, weight loss, swollen lymph nodes, body aches, joint swelling, muscle aches, chest pain, shortness of breath, mood changes.   Objective Blood pressure 136/90, pulse 86, height 6\' 5"  (1.956 m), weight 275 lb (124.739 kg), SpO2 97 %.  General: No apparent distress alert and oriented x3 mood and affect normal, dressed appropriately.  HEENT: Pupils equal, extraocular movements intact  Respiratory: Patient's speak in full sentences and does not appear short of breath  Cardiovascular: No lower extremity edema, non tender, no erythema  Skin: Warm dry intact with no signs of infection or rash on extremities or on axial skeleton.  Abdomen: Soft nontender  Neuro: Cranial nerves II through XII are intact, neurovascularly intact in all extremities with 2+ DTRs and 2+ pulses.  Lymph: No lymphadenopathy of posterior or anterior cervical chain or axillae bilaterally.  Gait normal with good balance and coordination.  MSK:  Non tender with full range of motion and good stability and symmetric strength and tone of shoulders, elbows, wrist, hip, knee and ankles bilaterally.  Neck: Inspection unremarkable. No palpable stepoffs. Mild positive Spurling's on the left side with mild radicular symptoms to the lateral aspect of the shoulder Limits the last 5 of rotation and side bending Grip strength and sensation  normal in bilateral hands Strength good C4 to T1 distribution No sensory change to C4 to T1 Negative Hoffman sign bilaterally Reflexes normal Shoulder: Bilaterally Inspection reveals no abnormalities, atrophy or asymmetry. Palpation is normal with no tenderness over AC joint or bicipital groove. ROM is full in all planes. Rotator cuff strength normal  throughout. Minimal impingement signs bilaterally Speeds and Yergason's tests normal. No labral pathology noted with negative Obrien's, negative clunk and good stability. Moderate scapular dyskinesia bilaterally with mild winging on the right side No painful arc and no drop arm sign. No apprehension sign  Osteopathic findings C2 flexed rotated and side bent right C4 flexed rotated and side bent left C5 flexed rotated and side bent right T1 extended rotated and side bent left with elevated first rib T3 extended rotated and side bent right with inhaled third rib  Procedure note 97110; 15 minutes spent for Therapeutic exercises as stated in above notes.  This included exercises focusing on stretching, strengthening, with significant focus on eccentric aspects. Basic scapular stabilization to include adduction and depression of scapula Scaption, focusing on proper movement and good control Internal and External rotation utilizing a theraband, with elbow tucked at side entire time Rows with theraband  Proper technique shown and discussed handout in great detail with ATC.  All questions were discussed and answered.      Impression and Recommendations:     This case required medical decision making of moderate complexity.

## 2015-07-27 NOTE — Assessment & Plan Note (Signed)
Home exercises given today. Work with Product/process development scientist. Discussed postural changes. Patient will do anti-inflammatory, icing, and over-the-counter medications. Patient did respond well to manipulation. We'll see patient back again in 3 weeks. Concerned and some could be cervical radiculopathy as well. X-rays ordered today to further evaluate.

## 2015-08-10 ENCOUNTER — Ambulatory Visit: Payer: Self-pay | Admitting: Physician Assistant

## 2015-08-10 ENCOUNTER — Encounter: Payer: Self-pay | Admitting: Physician Assistant

## 2015-08-10 VITALS — BP 125/80 | HR 74 | Temp 98.3°F

## 2015-08-10 DIAGNOSIS — J029 Acute pharyngitis, unspecified: Secondary | ICD-10-CM

## 2015-08-10 MED ORDER — AZITHROMYCIN 250 MG PO TABS: ORAL_TABLET | ORAL | Status: DC

## 2015-08-10 NOTE — Progress Notes (Signed)
S: c/o sore throat, no fever/chills, no cough or congestion, sx for about 2 -3 days  O: vitals wnl, nad, tms dull, nasal mucosa wnl, throat injected + cobblestoning, area is red, neck supple no lymph, lungs c t a, cv rrr  A: acute pharyngitis  P: zpack

## 2015-08-16 ENCOUNTER — Ambulatory Visit (INDEPENDENT_AMBULATORY_CARE_PROVIDER_SITE_OTHER): Payer: Managed Care, Other (non HMO) | Admitting: Family Medicine

## 2015-08-16 ENCOUNTER — Encounter: Payer: Self-pay | Admitting: Family Medicine

## 2015-08-16 VITALS — BP 132/84 | HR 69 | Ht 77.0 in | Wt 274.0 lb

## 2015-08-16 DIAGNOSIS — M9902 Segmental and somatic dysfunction of thoracic region: Secondary | ICD-10-CM

## 2015-08-16 DIAGNOSIS — M9908 Segmental and somatic dysfunction of rib cage: Secondary | ICD-10-CM | POA: Diagnosis not present

## 2015-08-16 DIAGNOSIS — M5010 Cervical disc disorder with radiculopathy, unspecified cervical region: Secondary | ICD-10-CM

## 2015-08-16 DIAGNOSIS — M9901 Segmental and somatic dysfunction of cervical region: Secondary | ICD-10-CM | POA: Diagnosis not present

## 2015-08-16 DIAGNOSIS — M999 Biomechanical lesion, unspecified: Secondary | ICD-10-CM

## 2015-08-16 MED ORDER — IBUPROFEN-FAMOTIDINE 800-26.6 MG PO TABS
ORAL_TABLET | ORAL | Status: DC
Start: 2015-08-16 — End: 2018-02-08

## 2015-08-16 NOTE — Progress Notes (Signed)
Nicholas Cook, Nicholas Cook Phone: [phone] Subjective:       CC: Bilateral shoulder and neck pain follow-up  [id number] Nicholas Cook is a 25 y.o. male coming in with complaint of bilateral shoulder and neck pain patient was doing weightlifting when it did occur. Patient states now it seems to be improving very slowly. Patient describes pain as a dull, throbbing aching sensation. States that the strength seems to be coming back. Still has some tightness on long and lower portion of the left neck. Seems to have more towards his left shoulder. Has been able to start lifting on a more regular basis. Did not give any the over-the-counter medications. Feels that the anti-inflammatories are not helpful. No longer having radiation down the arms.  Past Medical History  Diagnosis Date  . Meningitis   . ADD (attention deficit disorder)    Past Surgical History  Procedure Laterality Date  . Tonsillectomy     Social History  Substance Use Topics  . Smoking status: Never Smoker   . Smokeless tobacco: Current User  . Alcohol Use: No   Allergies  Allergen Reactions  . Penicillin G Other (See Comments)    Pt. Reports Mom told him he was allergic.   Family History  Problem Relation Age of Onset  . Hyperlipidemia Mother   . Hypertension Mother         Past medical history, social, surgical and family history all reviewed in electronic medical record.   Review of Systems: No headache, visual changes, nausea, vomiting, diarrhea, constipation, dizziness, abdominal pain, skin rash, fevers, chills, night sweats, weight loss, swollen lymph nodes, body aches, joint swelling, muscle aches, chest pain, shortness of breath, mood changes.   Objective Blood pressure 132/84, pulse 69, height 6\' 5"  (1.956 m), weight 274 lb (124.286 kg), SpO2 98 %.  General: No apparent distress alert and oriented x3 mood and affect normal, dressed  appropriately.  HEENT: Pupils equal, extraocular movements intact  Respiratory: Patient's speak in full sentences and does not appear short of breath  Cardiovascular: No lower extremity edema, non tender, no erythema  Skin: Warm dry intact with no signs of infection or rash on extremities or on axial skeleton.  Abdomen: Soft nontender  Neuro: Cranial nerves II through XII are intact, neurovascularly intact in all extremities with 2+ DTRs and 2+ pulses.  Lymph: No lymphadenopathy of posterior or anterior cervical chain or axillae bilaterally.  Gait normal with good balance and coordination.  MSK:  Non tender with full range of motion and good stability and symmetric strength and tone of shoulders, elbows, wrist, hip, knee and ankles bilaterally.  Neck: Inspection unremarkable. No palpable stepoffs. Negative Spurling's which is an improvement Limits the last 5 of rotation and side bending Grip strength and sensation normal in bilateral hands Strength good C4 to T1 distribution No sensory change to C4 to T1 Negative Hoffman sign bilaterally Reflexes normal Shoulder: Bilaterally Inspection reveals no abnormalities, atrophy or asymmetry. Palpation is normal with no tenderness over AC joint or bicipital groove. ROM is full in all planes. Rotator cuff strength normal throughout. Very minimal impingement sign still remaining bilaterally left greater than right Speeds and Yergason's tests normal. No labral pathology noted with negative Obrien's, negative clunk and good stability. Moderate scapular dyskinesia bilaterally with mild winging on the right side No painful arc and no drop arm sign. No apprehension sign  Osteopathic findings C2 flexed rotated and side bent  right C4 flexed rotated and side bent left C5 flexed rotated and side bent right T1 extended rotated and side bent left with elevated first rib T3 extended rotated and side bent right with inhaled third rib         Impression and Recommendations:     This case required medical decision making of moderate complexity.

## 2015-08-16 NOTE — Assessment & Plan Note (Signed)
Decision today to treat with OMT was based on Physical Exam  After verbal consent patient was treated with HVLA, ME, FPR techniques in cervical, rim and thoracic areas  Patient tolerated the procedure well with improvement in symptoms  Patient given exercises, stretches and lifestyle modifications  See medications in patient instructions if given  Patient will follow up in 3-4 weeks

## 2015-08-16 NOTE — Assessment & Plan Note (Signed)
I do believe the patient is going to do relatively well overall. Patient is no longer having severe pain. None of the radiation to the arms. I do think patient could potentially herniated disc bursa possibility of just a nerve neuritis secondary to the injury. Patient is making progress. Change patient's anti-inflammatory to see if we get some benefit. Patient to continue with the exercises. Is responding well to home exercises a manipulation. Return in 3-6 weeks for further evaluation.

## 2015-08-16 NOTE — Progress Notes (Signed)
Pre visit review using our clinic review tool, if applicable. No additional management support is needed unless otherwise documented below in the visit note. 

## 2015-08-16 NOTE — Patient Instructions (Signed)
Great to see you Nicholas Cook is your friend OK to start lifting a little more Duexis up to 3 times daily as needed Keep up with the exercises See me if you can within in the next 6 weeks or call when you can and we will try to make changes.

## 2015-08-17 ENCOUNTER — Encounter: Payer: Self-pay | Admitting: Physician Assistant

## 2015-08-17 ENCOUNTER — Ambulatory Visit: Payer: Self-pay | Admitting: Physician Assistant

## 2015-08-17 VITALS — BP 136/90 | Temp 98.7°F

## 2015-08-17 DIAGNOSIS — F988 Other specified behavioral and emotional disorders with onset usually occurring in childhood and adolescence: Secondary | ICD-10-CM

## 2015-08-17 MED ORDER — AMPHETAMINE-DEXTROAMPHETAMINE 10 MG PO TABS: ORAL_TABLET | ORAL | Status: DC

## 2015-08-17 NOTE — Progress Notes (Signed)
S: here for med refill for ADD, pt is taking medications as prescribed, dea list shows that he only gets this medication here  O: vitals wnl, nad, good mood and affect  A: add  P: adderal 10mg  #90 nr

## 2015-09-16 ENCOUNTER — Ambulatory Visit: Payer: Self-pay | Admitting: Physician Assistant

## 2015-09-16 VITALS — BP 120/80 | HR 77 | Temp 98.0°F

## 2015-09-16 DIAGNOSIS — F988 Other specified behavioral and emotional disorders with onset usually occurring in childhood and adolescence: Secondary | ICD-10-CM

## 2015-09-16 MED ORDER — AMPHETAMINE-DEXTROAMPHETAMINE 10 MG PO TABS: ORAL_TABLET | ORAL | Status: DC

## 2015-09-16 NOTE — Progress Notes (Signed)
Pt here for med refill, vitals wnl, no problems with medication

## 2015-10-15 ENCOUNTER — Encounter: Payer: Self-pay | Admitting: Physician Assistant

## 2015-10-15 ENCOUNTER — Ambulatory Visit: Payer: Self-pay | Admitting: Physician Assistant

## 2015-10-15 VITALS — BP 119/80 | HR 85 | Temp 98.3°F

## 2015-10-15 DIAGNOSIS — F988 Other specified behavioral and emotional disorders with onset usually occurring in childhood and adolescence: Secondary | ICD-10-CM

## 2015-10-15 MED ORDER — AMPHETAMINE-DEXTROAMPHETAMINE 10 MG PO TABS: ORAL_TABLET | ORAL | Status: DC

## 2015-10-15 NOTE — Progress Notes (Signed)
S: here for med refill for adderall, been on medication for years, no problems with medication, dea list shows pt only gets his adderall rx from here  O; vitals wnl, nad, neuro intact  A: adult add  P: adderall 10mg  2 in am 1 in pm #90 nr

## 2015-11-01 ENCOUNTER — Encounter: Payer: Self-pay | Admitting: Physician Assistant

## 2015-11-01 ENCOUNTER — Ambulatory Visit: Payer: Self-pay | Admitting: Physician Assistant

## 2015-11-01 VITALS — BP 139/80 | HR 80 | Temp 99.0°F

## 2015-11-01 DIAGNOSIS — F988 Other specified behavioral and emotional disorders with onset usually occurring in childhood and adolescence: Secondary | ICD-10-CM

## 2015-11-01 DIAGNOSIS — R509 Fever, unspecified: Secondary | ICD-10-CM

## 2015-11-01 DIAGNOSIS — J018 Other acute sinusitis: Secondary | ICD-10-CM

## 2015-11-01 LAB — POCT INFLUENZA A/B
INFLUENZA A, POC: NEGATIVE
INFLUENZA B, POC: NEGATIVE

## 2015-11-01 MED ORDER — CEFDINIR 300 MG PO CAPS
300.0000 mg | ORAL_CAPSULE | Freq: Two times a day (BID) | ORAL | Status: DC
Start: 2015-11-01 — End: 2015-12-13

## 2015-11-01 MED ORDER — FLUTICASONE PROPIONATE 50 MCG/ACT NA SUSP: 2.0000 | Freq: Every day | NASAL | Status: DC

## 2015-11-01 MED ORDER — AMPHETAMINE-DEXTROAMPHETAMINE 10 MG PO TABS: ORAL_TABLET | ORAL | Status: DC

## 2015-11-01 NOTE — Progress Notes (Signed)
S: C/o runny nose and congestion with dry cough and sore throat for 3 days, + fever, chills, denies cp/sob, v/d; mucus was green this am but clear throughout the day, cough is sporadic,   Using otc meds: robitussin  O: PE: vitals wnl, nad,  perrl eomi, normocephalic, tms dull, nasal mucosa red and swollen, throat injected, neck supple no lymph, lungs c t a, cv rrr, neuro intact, flu swab neg  A:  Acute flu like illness. Adult add   P: omnicef, flonase; drink fluids, continue regular meds , use otc meds of choice, return if not improving in 5 days, return earlier if worsening , also postdated rx for adderall

## 2015-12-13 ENCOUNTER — Ambulatory Visit: Payer: Self-pay | Admitting: Physician Assistant

## 2015-12-13 ENCOUNTER — Encounter: Payer: Self-pay | Admitting: Physician Assistant

## 2015-12-13 VITALS — BP 119/80 | HR 85 | Temp 98.7°F

## 2015-12-13 DIAGNOSIS — F988 Other specified behavioral and emotional disorders with onset usually occurring in childhood and adolescence: Secondary | ICD-10-CM

## 2015-12-13 MED ORDER — AMPHETAMINE-DEXTROAMPHETAMINE 10 MG PO TABS: ORAL_TABLET | ORAL | Status: DC

## 2015-12-13 NOTE — Progress Notes (Signed)
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

## 2016-01-11 ENCOUNTER — Ambulatory Visit: Payer: Self-pay | Admitting: Physician Assistant

## 2016-01-11 ENCOUNTER — Encounter: Payer: Self-pay | Admitting: Physician Assistant

## 2016-01-11 VITALS — BP 120/74 | HR 78 | Temp 98.2°F

## 2016-01-11 DIAGNOSIS — F988 Other specified behavioral and emotional disorders with onset usually occurring in childhood and adolescence: Secondary | ICD-10-CM

## 2016-01-11 MED ORDER — AMPHETAMINE-DEXTROAMPHETAMINE 10 MG PO TABS: ORAL_TABLET | ORAL | Status: DC

## 2016-01-11 NOTE — Progress Notes (Signed)
S: here for med refill of adderall, [place] substance reporting website shows he only gets his med here  O: vitals wnl, nad, neuro intact  A: adult add  P: adderall 10mg  2 in am and 1 in pm

## 2016-02-07 ENCOUNTER — Ambulatory Visit: Payer: Self-pay | Admitting: Physician Assistant

## 2016-02-07 VITALS — BP 119/75 | HR 107 | Temp 98.3°F

## 2016-02-07 DIAGNOSIS — M5416 Radiculopathy, lumbar region: Secondary | ICD-10-CM

## 2016-02-07 NOTE — Progress Notes (Signed)
S: tingling sensation bilat back to anterior chest when hot or working out x 1 month.  Not in [facility] or known at night.  No injury.  No involvement of arms. No SOB or chest pain.  Gets hot a lot.  Chewing nicotine gum approx 10 pieces a day.  4mg  each.   O: Lung cl. Heart RRR  No deformity or rash seen.  None tender to palpation.  Skin w/o discoloration.    A: intermittent radiculopathy ?   P: pt to decrease use of nicotine gum and note any changes and if AC any sx or not.  Lab nl, EKG done 6 mos or less wnl

## 2016-02-17 NOTE — Progress Notes (Signed)
Patient ID: Esperanza Richters, male   DOB: [birth year], 25 y.o.   MRN: [id number] Patient was given his monthly script for Adderall 10mg  #90 2 in am & 1 in pm per Susan's signature.

## 2016-02-22 ENCOUNTER — Telehealth: Payer: Self-pay | Admitting: Emergency Medicine

## 2016-02-22 NOTE — Telephone Encounter (Signed)
Patient expressed that the cuticle of his right middle finger is swollen and painful.  I advised Manuela Schwartz and she authorized me to call in Septra DS in to [facility] on [address].  I also advised the patient to soak his finger in [place] salt.

## 2016-03-09 ENCOUNTER — Encounter: Payer: Self-pay | Admitting: Physician Assistant

## 2016-03-09 ENCOUNTER — Ambulatory Visit: Payer: Self-pay | Admitting: Physician Assistant

## 2016-03-09 DIAGNOSIS — F988 Other specified behavioral and emotional disorders with onset usually occurring in childhood and adolescence: Secondary | ICD-10-CM

## 2016-03-09 MED ORDER — AMPHETAMINE-DEXTROAMPHETAMINE 10 MG PO TABS: ORAL_TABLET | ORAL | 0 refills | Status: DC

## 2016-03-09 NOTE — Progress Notes (Signed)
S: here for refill of adderall, pt is taking as rx'd  O: vitals wnl, nad  A: med refill  P: adderall 10mg  #90 nr

## 2016-03-30 ENCOUNTER — Ambulatory Visit: Payer: Self-pay | Admitting: Physician Assistant

## 2016-03-30 ENCOUNTER — Encounter: Payer: Self-pay | Admitting: Physician Assistant

## 2016-03-30 VITALS — BP 110/60 | HR 103 | Temp 99.1°F

## 2016-03-30 DIAGNOSIS — A084 Viral intestinal infection, unspecified: Secondary | ICD-10-CM

## 2016-03-30 MED ORDER — PROMETHAZINE HCL 25 MG PO TABS: 25.0000 mg | ORAL_TABLET | Freq: Three times a day (TID) | ORAL | 0 refills | Status: DC | PRN

## 2016-03-30 NOTE — Progress Notes (Signed)
S:  Pt c/o vomiting and diarrhea, sx for 1 day, + fever/chills, no abd pain except for cramping with diarrhea; denies cp/sob, denies camping, bad food, recent antibiotics, or exposure to bad water, states he did eat sushi yesterday but it didn't have raw fish, also ate at [employer], v x 1 , diarrhea several times Remainder ros neg  O:  Vitals welevated temp and pulse, nad, ENT wnl, neck supple no lymph, lungs c t a, cv rrr, abd soft nontender bs increased lower quads b/l, neuro intact  A:  Viral gastroenteritis  P:  Reassurance, fluids, brat diet, immodium ad for diarrhea if needed, rx phenergan 25mg  tid prn vomiting, return if not better in 3 days, return earlier if worsening

## 2016-04-07 ENCOUNTER — Ambulatory Visit: Payer: Self-pay | Admitting: Physician Assistant

## 2016-04-07 ENCOUNTER — Encounter: Payer: Self-pay | Admitting: Physician Assistant

## 2016-04-07 DIAGNOSIS — F988 Other specified behavioral and emotional disorders with onset usually occurring in childhood and adolescence: Secondary | ICD-10-CM

## 2016-04-07 MED ORDER — AMPHETAMINE-DEXTROAMPHETAMINE 10 MG PO TABS: ORAL_TABLET | ORAL | 0 refills | Status: DC

## 2016-04-07 NOTE — Progress Notes (Signed)
S: here for refill of adderall, no problems with medication, no evidence of getting meds anywhere else on [place] prescribing website  O: vitals wnl, nad, neuro intact  A: adult add  P: adderall 10mg  #90 nr

## 2016-05-05 ENCOUNTER — Ambulatory Visit: Payer: Self-pay | Admitting: Physician Assistant

## 2016-05-05 ENCOUNTER — Encounter: Payer: Self-pay | Admitting: Physician Assistant

## 2016-05-05 VITALS — BP 110/78

## 2016-05-05 DIAGNOSIS — F988 Other specified behavioral and emotional disorders with onset usually occurring in childhood and adolescence: Secondary | ICD-10-CM

## 2016-05-05 MED ORDER — AMPHETAMINE-DEXTROAMPHETAMINE 20 MG PO TABS: 20.0000 mg | ORAL_TABLET | Freq: Every day | ORAL | 0 refills | Status: DC

## 2016-05-05 NOTE — Progress Notes (Signed)
S: here for med refill, states he has been on this dose for a number of years, has been taking an extra 1/2 on the days he works to help keep him focused, states he and his coworkers can tell when his medication is wearing off, ? If we should increase it or go to different medication  O: vitals wnl, nad, lungs c t a, cv rrr, good mood and affect  A: adult add  P: adderall 20mg  bid, medication is scored so that on days he is off he can take less of medication, recheck in 1 month

## 2016-05-12 ENCOUNTER — Ambulatory Visit: Payer: Self-pay | Admitting: Physician Assistant

## 2016-05-12 ENCOUNTER — Encounter: Payer: Self-pay | Admitting: Physician Assistant

## 2016-05-12 VITALS — BP 110/90 | HR 60 | Temp 97.8°F

## 2016-05-12 DIAGNOSIS — J340 Abscess, furuncle and carbuncle of nose: Secondary | ICD-10-CM

## 2016-05-12 MED ORDER — SULFAMETHOXAZOLE-TRIMETHOPRIM 800-160 MG PO TABS: 1.0000 | ORAL_TABLET | Freq: Two times a day (BID) | ORAL | 0 refills | Status: DC

## 2016-05-12 NOTE — Progress Notes (Signed)
S: c/o pain in nose, states he used nose hair clippers and now area is swollen and hurts, similar sx in past, resolved with antibiotics, no trauma to area, no bleeding, no swelling, no drainage, denies fever/chills  O: vitals wnl, nad, tms clear, nasal mucosa red, septum is swollen on left side, tender to touch, max sinus wnl, neck supple no lymph, lungs c t a, cv rrr  A: carbuncle, cellulitis of septum  P: septra ds 1 po bid

## 2016-06-02 ENCOUNTER — Ambulatory Visit: Payer: Self-pay | Admitting: Physician Assistant

## 2016-06-02 VITALS — BP 120/80 | HR 84 | Temp 98.5°F

## 2016-06-02 DIAGNOSIS — F988 Other specified behavioral and emotional disorders with onset usually occurring in childhood and adolescence: Secondary | ICD-10-CM

## 2016-06-02 MED ORDER — AMPHETAMINE-DEXTROAMPHETAMINE 20 MG PO TABS: 20.0000 mg | ORAL_TABLET | Freq: Two times a day (BID) | ORAL | 0 refills | Status: DC

## 2016-06-02 NOTE — Progress Notes (Signed)
S: here for med refill, doing well on new dosage  O: vitals wnl, nad, neuro intact  A: adult add  P: adderal 20mg  bid

## 2016-06-13 ENCOUNTER — Ambulatory Visit: Payer: Self-pay | Admitting: Physician Assistant

## 2016-06-13 ENCOUNTER — Encounter: Payer: Self-pay | Admitting: Physician Assistant

## 2016-06-13 VITALS — BP 136/75 | HR 72 | Temp 98.3°F

## 2016-06-13 DIAGNOSIS — J340 Abscess, furuncle and carbuncle of nose: Secondary | ICD-10-CM

## 2016-06-13 MED ORDER — MUPIROCIN 2 % EX OINT: 1.0000 "application " | TOPICAL_OINTMENT | Freq: Two times a day (BID) | CUTANEOUS | 2 refills | Status: DC

## 2016-06-13 NOTE — Progress Notes (Signed)
S: c/o bump in nose, area got better with antibiotic, now feels like it is coming back, used h202 on it and didn't help, no fever/chills or drainage  O: vitals wnl, nad, skin with dry appearance on area in nose, small bump noted in anterior fold of nose at tip, lungs c t a, cv rrr  A: nasal furuncle  P: bactroban ointment

## 2016-07-06 ENCOUNTER — Ambulatory Visit: Payer: Self-pay | Admitting: Physician Assistant

## 2016-07-06 ENCOUNTER — Encounter: Payer: Self-pay | Admitting: Physician Assistant

## 2016-07-06 VITALS — BP 125/80 | HR 86 | Temp 98.7°F

## 2016-07-06 DIAGNOSIS — F988 Other specified behavioral and emotional disorders with onset usually occurring in childhood and adolescence: Secondary | ICD-10-CM

## 2016-07-06 MED ORDER — AMPHETAMINE-DEXTROAMPHETAMINE 20 MG PO TABS: 20.0000 mg | ORAL_TABLET | Freq: Two times a day (BID) | ORAL | 0 refills | Status: DC

## 2016-07-06 NOTE — Progress Notes (Signed)
S: pt here for med refill, doing well with medication  O: vitals wnl  A: adult add  P: adderall 20mg  bid #60 nr

## 2016-08-04 ENCOUNTER — Ambulatory Visit: Payer: Self-pay | Admitting: Physician Assistant

## 2016-08-04 VITALS — BP 129/90 | HR 80

## 2016-08-04 DIAGNOSIS — F988 Other specified behavioral and emotional disorders with onset usually occurring in childhood and adolescence: Secondary | ICD-10-CM

## 2016-08-04 MED ORDER — AMPHETAMINE-DEXTROAMPHETAMINE 20 MG PO TABS: 20.0000 mg | ORAL_TABLET | Freq: Two times a day (BID) | ORAL | 0 refills | Status: DC

## 2016-08-04 NOTE — Progress Notes (Signed)
Here for med refill of adderall, doing well on medication, knows to keep meds in safe and secure place

## 2016-09-05 ENCOUNTER — Ambulatory Visit: Payer: Self-pay | Admitting: Physician Assistant

## 2016-09-05 VITALS — BP 110/70 | HR 134 | Temp 98.6°F

## 2016-09-05 DIAGNOSIS — F988 Other specified behavioral and emotional disorders with onset usually occurring in childhood and adolescence: Secondary | ICD-10-CM

## 2016-09-05 MED ORDER — AMPHETAMINE-DEXTROAMPHETAMINE 20 MG PO TABS: 20.0000 mg | ORAL_TABLET | Freq: Two times a day (BID) | ORAL | 0 refills | Status: DC

## 2016-09-05 NOTE — Progress Notes (Signed)
S: here for med refill, pt is doing well on medication, [place] controlled substance site shows pt gets his meds here routinely and has not gotten them from other providers  O: vitals wnl, nad, good mood and affect, neuro intact  A: adult add  P: refill on adderall 20mg  bid #60 nr, pt knows to keep medications in safe and secure place

## 2016-10-04 ENCOUNTER — Encounter: Payer: Self-pay | Admitting: Physician Assistant

## 2016-10-04 ENCOUNTER — Ambulatory Visit: Payer: Self-pay | Admitting: Physician Assistant

## 2016-10-04 VITALS — BP 140/90

## 2016-10-04 DIAGNOSIS — F988 Other specified behavioral and emotional disorders with onset usually occurring in childhood and adolescence: Secondary | ICD-10-CM

## 2016-10-04 MED ORDER — AMPHETAMINE-DEXTROAMPHETAMINE 20 MG PO TABS: 20.0000 mg | ORAL_TABLET | Freq: Two times a day (BID) | ORAL | 0 refills | Status: DC

## 2016-10-04 NOTE — Progress Notes (Signed)
S: here for med refill, states he is doing well on this dosage, no problems  O: vitals wnl, nad, neuro intact  A: adult add  P: refill on adderall 20mg  bid, #60 nr

## 2016-10-19 ENCOUNTER — Telehealth: Payer: Self-pay | Admitting: Emergency Medicine

## 2016-10-19 NOTE — Telephone Encounter (Signed)
Patient called and expressed that he has a recurrent skin infection in his groin area and wanted to know if the provider can call in an antibiotic.  I informed Manuela Schwartz and she authorized me to call in [place] DS #[phone] with no refill in to [facility] on [address].

## 2016-11-02 ENCOUNTER — Ambulatory Visit: Payer: Self-pay | Admitting: Physician Assistant

## 2016-11-03 ENCOUNTER — Ambulatory Visit: Payer: Self-pay | Admitting: Physician Assistant

## 2016-11-03 DIAGNOSIS — F988 Other specified behavioral and emotional disorders with onset usually occurring in childhood and adolescence: Secondary | ICD-10-CM

## 2016-11-03 MED ORDER — AMPHETAMINE-DEXTROAMPHETAMINE 20 MG PO TABS: 20.0000 mg | ORAL_TABLET | Freq: Two times a day (BID) | ORAL | 0 refills | Status: DC

## 2016-11-03 NOTE — Progress Notes (Signed)
Pt here for med refill.

## 2016-12-06 ENCOUNTER — Ambulatory Visit: Payer: Self-pay | Admitting: Physician Assistant

## 2016-12-06 DIAGNOSIS — F988 Other specified behavioral and emotional disorders with onset usually occurring in childhood and adolescence: Secondary | ICD-10-CM

## 2016-12-06 MED ORDER — AMPHETAMINE-DEXTROAMPHETAMINE 20 MG PO TABS: 20.0000 mg | ORAL_TABLET | Freq: Two times a day (BID) | ORAL | 0 refills | Status: DC

## 2016-12-06 NOTE — Progress Notes (Signed)
S: here for med refill of adderall, doing well on medication  O: vitals wnl, nad, neuro intact  A: med refill  P: adderall 20mg  bid #60, nr

## 2016-12-07 ENCOUNTER — Ambulatory Visit: Payer: Self-pay | Admitting: Physician Assistant

## 2016-12-11 ENCOUNTER — Ambulatory Visit: Payer: Self-pay | Admitting: Physician Assistant

## 2016-12-11 ENCOUNTER — Encounter: Payer: Self-pay | Admitting: Physician Assistant

## 2016-12-11 VITALS — BP 139/90 | HR 97 | Temp 98.5°F | Ht 77.0 in | Wt 270.0 lb

## 2016-12-11 DIAGNOSIS — Z0000 Encounter for general adult medical examination without abnormal findings: Secondary | ICD-10-CM

## 2016-12-11 NOTE — Progress Notes (Signed)
S: pt here for wellness physical had biometrics for insurance purposes done at work, no complaints ros neg. PMH:  Adult add  Social: nonsmoker, married, 1 child Fam: father died at 40 of ?MI, (smoked and drank, was a long distance truck driver, not really sure why he died), mother is still living has htn and increased chole but is overweight, his grandparents on both sides lived into their 54s  O: vitals wnl, nad, ENT wnl, neck supple no lymph, lungs c t a, cv rrr, abd soft nontender bs normal all 4 quads  A: wellness physical  P: repeat physical and biometrics in december

## 2017-01-03 ENCOUNTER — Encounter: Payer: Self-pay | Admitting: Physician Assistant

## 2017-01-03 ENCOUNTER — Ambulatory Visit: Payer: Self-pay | Admitting: Physician Assistant

## 2017-01-03 DIAGNOSIS — F988 Other specified behavioral and emotional disorders with onset usually occurring in childhood and adolescence: Secondary | ICD-10-CM

## 2017-01-03 MED ORDER — AMPHETAMINE-DEXTROAMPHETAMINE 20 MG PO TABS: 20.0000 mg | ORAL_TABLET | Freq: Two times a day (BID) | ORAL | 0 refills | Status: DC

## 2017-01-03 NOTE — Progress Notes (Signed)
Needs med refill for adderall, states he is doing well on the medication, takes 30mg  when he isn't working, bp is better today as pt did not take his preworkout vitamins  refill on adderall, pt knows to keep meds in safe and secure place, knows abuse potential

## 2017-01-25 ENCOUNTER — Other Ambulatory Visit: Admission: EM | Admit: 2017-01-25 | Payer: Worker's Compensation | Admitting: Family Medicine

## 2017-02-01 ENCOUNTER — Ambulatory Visit: Payer: Self-pay | Admitting: Physician Assistant

## 2017-02-01 DIAGNOSIS — F988 Other specified behavioral and emotional disorders with onset usually occurring in childhood and adolescence: Secondary | ICD-10-CM

## 2017-02-01 NOTE — Progress Notes (Signed)
Patient came in to get a refill for his Adderall.

## 2017-02-05 MED ORDER — AMPHETAMINE-DEXTROAMPHETAMINE 20 MG PO TABS: 20.0000 mg | ORAL_TABLET | Freq: Two times a day (BID) | ORAL | 0 refills | Status: DC

## 2017-02-19 ENCOUNTER — Ambulatory Visit: Payer: Self-pay | Admitting: Physician Assistant

## 2017-03-05 ENCOUNTER — Encounter: Payer: Self-pay | Admitting: Physician Assistant

## 2017-03-05 ENCOUNTER — Ambulatory Visit: Payer: Self-pay | Admitting: Physician Assistant

## 2017-03-05 VITALS — BP 120/80 | HR 81 | Temp 98.5°F | Resp 16

## 2017-03-05 DIAGNOSIS — F988 Other specified behavioral and emotional disorders with onset usually occurring in childhood and adolescence: Secondary | ICD-10-CM

## 2017-03-05 DIAGNOSIS — S46912A Strain of unspecified muscle, fascia and tendon at shoulder and upper arm level, left arm, initial encounter: Secondary | ICD-10-CM

## 2017-03-05 NOTE — Progress Notes (Signed)
S: here for med refill of adderall, ?if they did 25mg  so he didn't have to cut the medication is 1/2 on his days off, 20-30 mg works well when he is not working, has to increase to 40-50 on days he works night shift, also some left shoulder pain, only hurts after working out, takes ibuprofen as needed, ?if could get a rx  O: vitals wnl, nad, cspine is not tender, left shoulder with full rom, grips = b/l, n/v intact  A: adult add, left shoulder strain  P: adderall 20mg  bid, #60 nr, ibuprofen 800mg  tid with food

## 2017-03-24 IMAGING — DX DG CERVICAL SPINE COMPLETE 4+V
6 series · 6 of 6 positions shown · non-contrast
Comparison: None.

CLINICAL DATA: Neck pain.  Bilateral arm pain.

EXAM:
CERVICAL SPINE - COMPLETE 4+ VIEW

[c-spine lat]
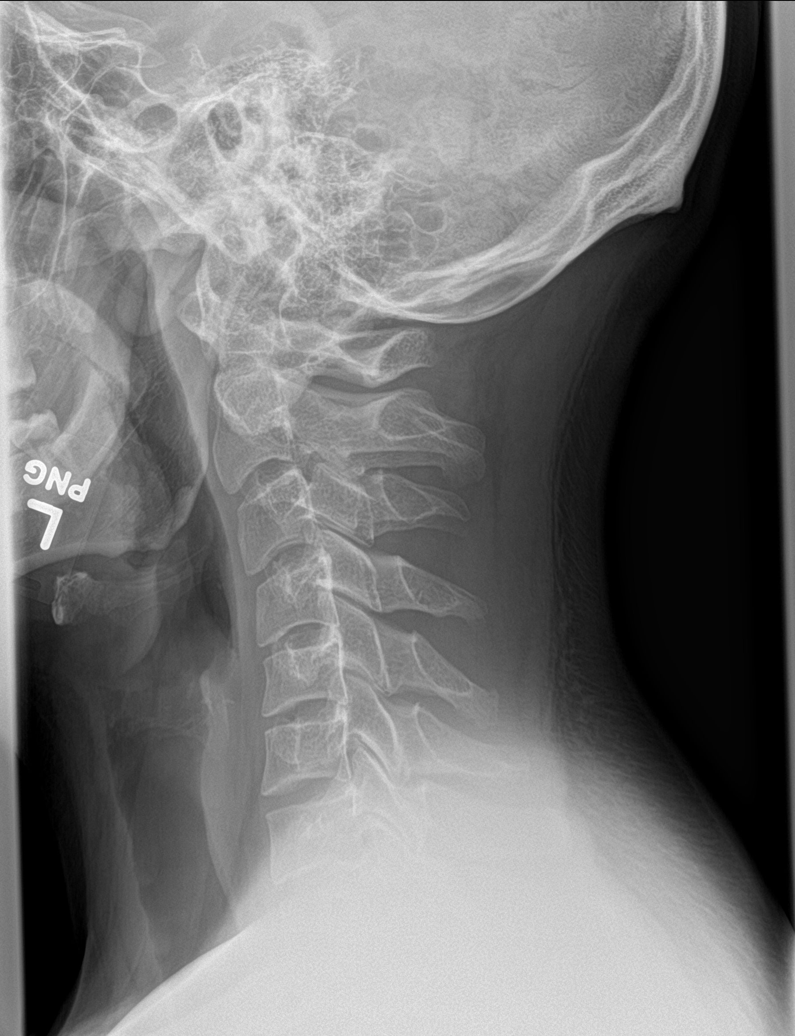

[c-spine obl (1 of 2)]
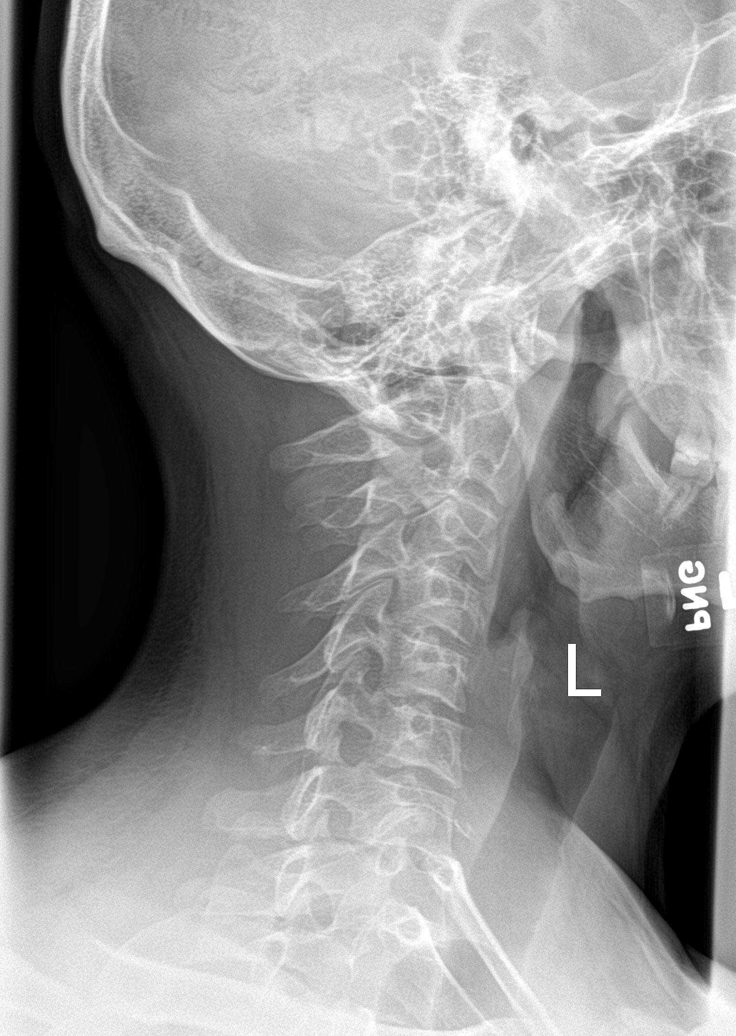

[c-spine obl (2 of 2)]
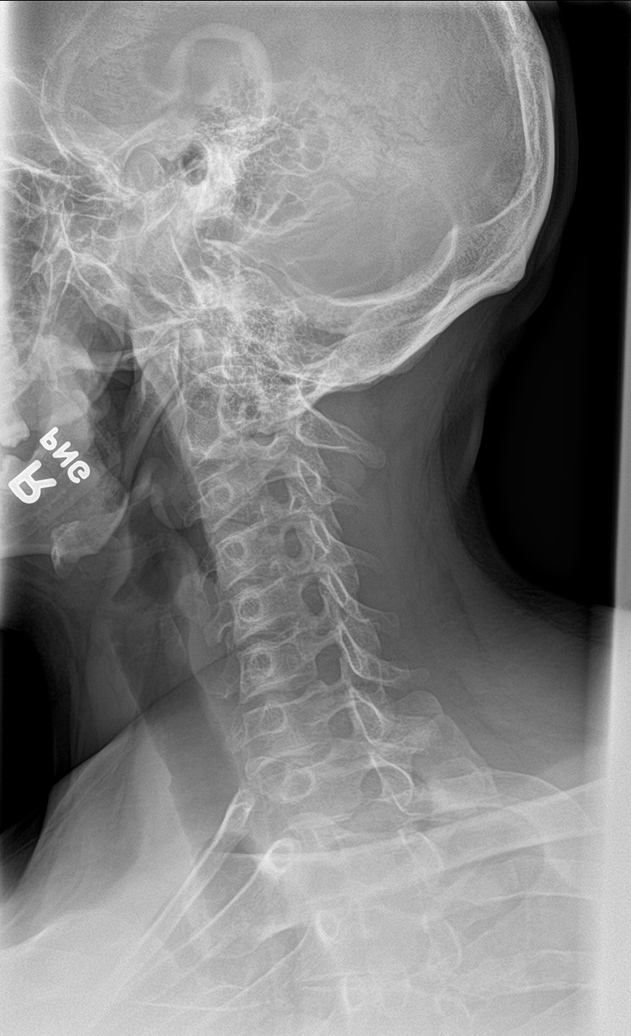

[c-spine ap]
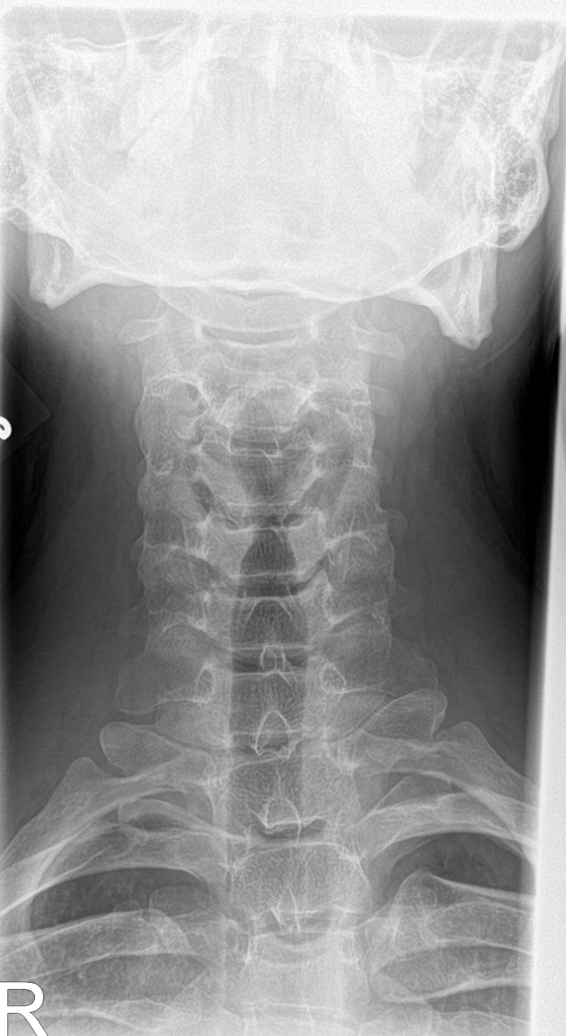

[c-spine open mouth]
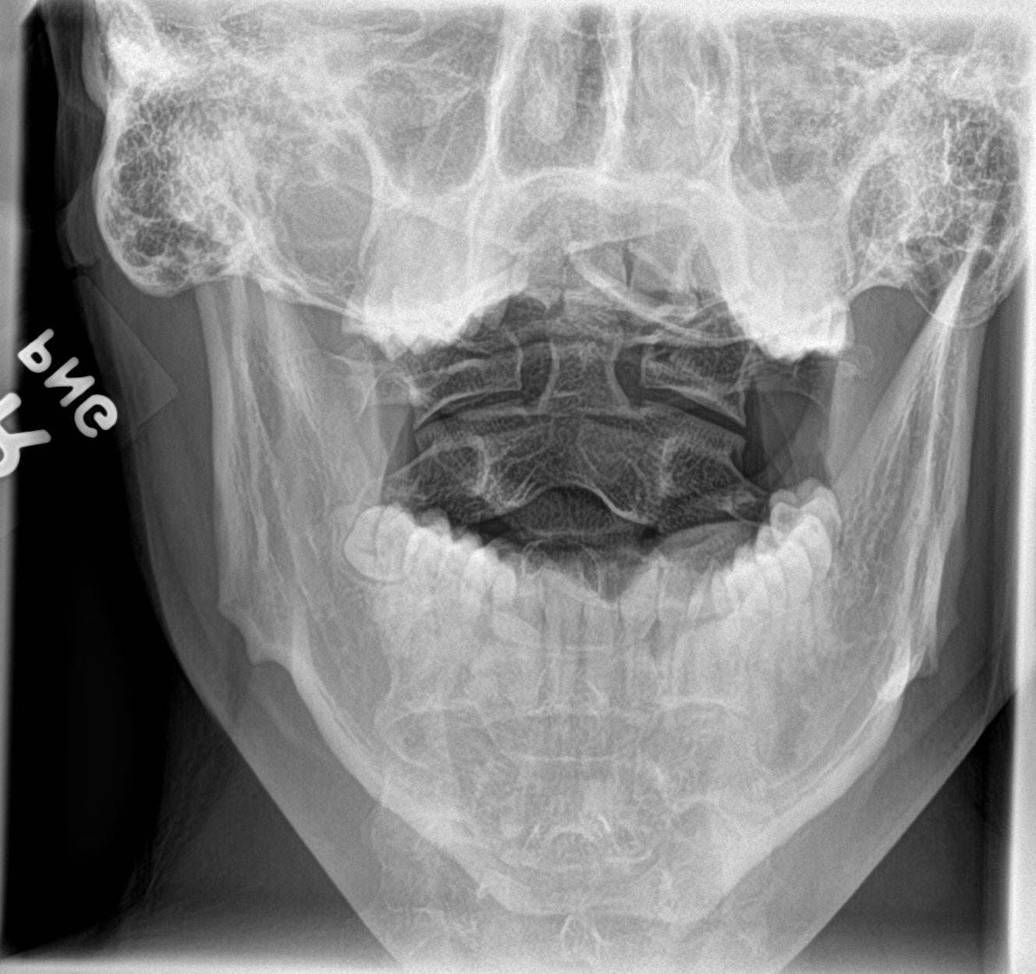

[swimmer]
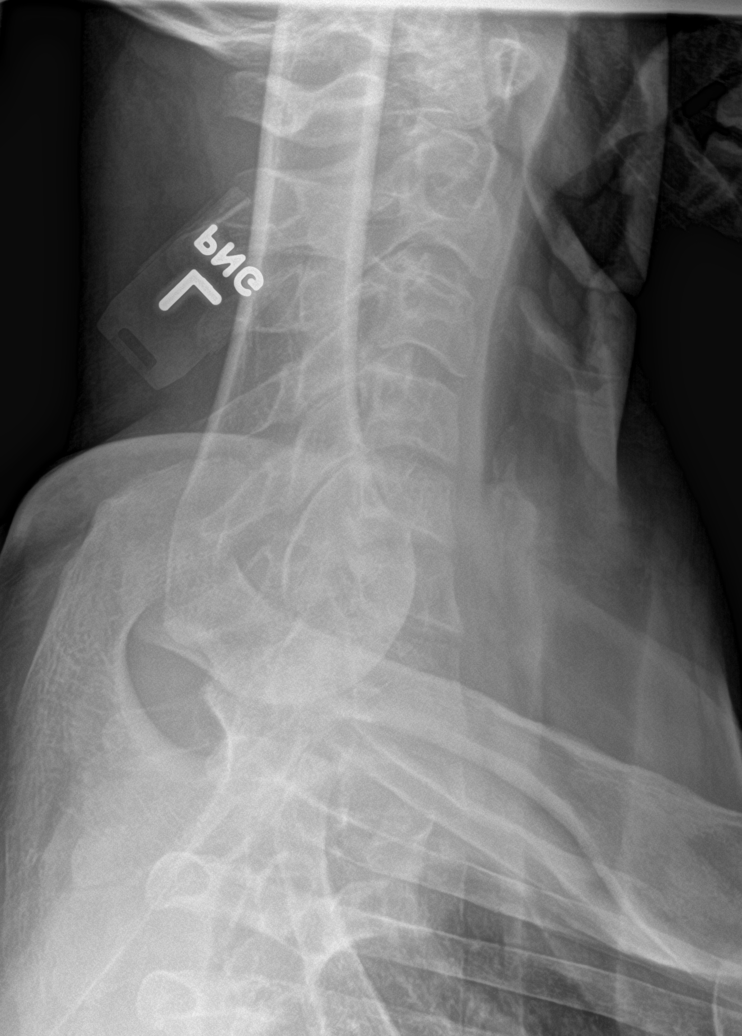

[6 of 6 positions shown; findings below may reference images not displayed]

FINDINGS: Soft tissue structures are unremarkable. Loss of normal cervical
lordosis. No evidence of fracture or dislocation. Pulmonary apices
are clear.
IMPRESSION: Loss of normal cervical lordosis. This may be related positioning,
torticollis, or ligamentous injury. No evidence of fracture or
dislocation.

## 2017-04-03 ENCOUNTER — Ambulatory Visit: Payer: Self-pay | Admitting: Physician Assistant

## 2017-04-03 ENCOUNTER — Encounter: Payer: Self-pay | Admitting: Physician Assistant

## 2017-04-03 DIAGNOSIS — F988 Other specified behavioral and emotional disorders with onset usually occurring in childhood and adolescence: Secondary | ICD-10-CM

## 2017-04-03 MED ORDER — AMPHETAMINE-DEXTROAMPHETAMINE 20 MG PO TABS: 20.0000 mg | ORAL_TABLET | Freq: Two times a day (BID) | ORAL | 0 refills | Status: DC

## 2017-04-03 NOTE — Progress Notes (Signed)
S: Here for med refill, doing well on medication  O: vitals wnl, nad, neuro intact  A: adult add  P: adderall 20mg  bid #60 nr, pt knows to keep medication is safe and secure place

## 2017-05-01 ENCOUNTER — Other Ambulatory Visit: Payer: Self-pay

## 2017-05-01 NOTE — Progress Notes (Signed)
Patient came in to pick up his med refill script for Adderall per Susan's authorization.

## 2017-05-28 ENCOUNTER — Ambulatory Visit: Payer: Worker's Compensation | Admitting: Physician Assistant

## 2017-05-28 ENCOUNTER — Encounter: Payer: Self-pay | Admitting: Physician Assistant

## 2017-05-28 DIAGNOSIS — F988 Other specified behavioral and emotional disorders with onset usually occurring in childhood and adolescence: Secondary | ICD-10-CM

## 2017-05-28 MED ORDER — AMPHETAMINE-DEXTROAMPHETAMINE 20 MG PO TABS: 20.0000 mg | ORAL_TABLET | Freq: Two times a day (BID) | ORAL | 0 refills | Status: AC

## 2017-05-28 NOTE — Progress Notes (Signed)
S: pt here for adderall refill, pt has been consistent with medication use, prescribing site shows he only gets his medication here, will refill for 3 months as the clinic may be without a provider for awhile  O: vitals wnl, nad, lungs c t a, cv rrr  A: adult add  P: adderall 20mg  bid #60 ; rx given x3 months (10/29, 11/29, 12/29)

## 2017-08-24 ENCOUNTER — Encounter: Payer: Self-pay | Admitting: Physician Assistant

## 2017-08-24 ENCOUNTER — Ambulatory Visit: Payer: Self-pay | Admitting: Physician Assistant

## 2017-08-24 VITALS — BP 129/79 | HR 72 | Temp 98.5°F | Resp 16

## 2017-08-24 DIAGNOSIS — Z760 Encounter for issue of repeat prescription: Secondary | ICD-10-CM

## 2017-08-24 NOTE — Progress Notes (Signed)
   Subjective: Medication refill    Patient ID: Nicholas Cook, male    DOB: 1991-06-06, 27 y.o.   MRN: [id number]  HPI  Patient presents for medication refill of Adderall which she receives monthly.  Patient voices no complaints.  Review of Systems Negative  Objective:   Physical Exam   Deferred     Assessment & Plan: Medication refill  Patient medication refill for Adderall was approved and prescription written.

## 2017-08-30 ENCOUNTER — Ambulatory Visit: Payer: Worker's Compensation | Admitting: Medical

## 2017-08-30 VITALS — BP 120/90 | HR 90 | Temp 98.6°F

## 2017-08-30 DIAGNOSIS — G8929 Other chronic pain: Secondary | ICD-10-CM

## 2017-08-30 DIAGNOSIS — M25512 Pain in left shoulder: Principal | ICD-10-CM

## 2017-08-30 MED ORDER — CYCLOBENZAPRINE HCL 10 MG PO TABS: 10.0000 mg | ORAL_TABLET | Freq: Every day | ORAL | 0 refills | Status: DC

## 2017-08-30 MED ORDER — PREDNISONE 10 MG (21) PO TBPK: ORAL_TABLET | ORAL | 0 refills | Status: DC

## 2017-08-30 NOTE — Patient Instructions (Signed)
Shoulder Pain Many things can cause shoulder pain, including:  An injury.  Moving the arm in the same way again and again (overuse).  Joint pain (arthritis).  Follow these instructions at home: Take these actions to help with your pain:  Squeeze a soft ball or a foam pad as much as you can. This helps to prevent swelling. It also makes the arm stronger.  Take over-the-counter and prescription medicines only as told by your doctor.  If told, put ice on the area: ? Put ice in a plastic bag. ? Place a towel between your skin and the bag. ? Leave the ice on for 20 minutes, 2-3 times per day. Stop putting on ice if it does not help with the pain.  If you were given a shoulder sling or immobilizer: ? Wear it as told. ? Remove it to shower or bathe. ? Move your arm as little as possible. ? Keep your hand moving. This helps prevent swelling.  Contact a doctor if:  Your pain gets worse.  Medicine does not help your pain.  You have new pain in your arm, hand, or fingers. Get help right away if:  Your arm, hand, or fingers: ? Tingle. ? Are numb. ? Are swollen. ? Are painful. ? Turn white or blue. This information is not intended to replace advice given to you by your health care provider. Make sure you discuss any questions you have with your health care provider. Document Released: 01/03/2008 Document Revised: 03/12/2016 Document Reviewed: 11/09/2014 Elsevier Interactive Patient Education  2018 Elsevier Inc.  

## 2017-08-30 NOTE — Progress Notes (Signed)
Subjective:    Patient ID: Nicholas Cook, male    DOB: [birth year], 29 y.o.   MRN: [id number]  HPI 27 yo male in non acute distress comes in with left shoulder pain since Friday or Saturday of last week  ( 6 days).  Went over the weekend to [place] and got into a hot tub which he says he aggravated his left shoulder. He says that heat has always aggravated his shoulder.   Prior problems for at least 3 years , seen by [facility] about one year ago, who told him he needed an MRI ( he voices he does not want to spend a lot of money on himself). Then seen twice in a walk-in clinics to the best of his recollection. Last visit he received a steroid injection which did seem to help his pain, plus he gave himself 2 weeks off from the gym (does lifting of weights and cardio) patient says he is  6'4 pushing 280lbs. He states at that point it" felt the best it ever has" and he thought the shoulder was getting better.   He  lifted weights yesterday for his workout and thinks he may have also slept on it wrong and that is why it is hurting today 5/10.  He complains of some tingling in the posterior shoulder area with pain starting left cervical paravertebral area and coming down into the shoulder.Stops at the  upper arm area. He denies any numbness.   He asks for a steroid dose pak which he states usually starts to work within  24 hours. He says he has not has any steroids in several years.   Took no medications for pain today. Currently on a fasting regimen and so did not take any Ibuprofen due to him not eating.   Review of Systems  Sharp pain with moving his neck to the left and back radiates to the left posterior shoulder. Tingling sensation in the posterior shoulder. Denies numbness or weakness in arm. He is right handed.    Objective:   Physical Exam  Constitutional: He is oriented to person, place, and time. He appears well-developed and well-nourished.  HENT:  Head:  Normocephalic and atraumatic.  Musculoskeletal: Normal range of motion. He exhibits no edema, tenderness or deformity.       Arms: Neurological: He is alert and oriented to person, place, and time.  Skin: Skin is warm and dry.  Psychiatric: He has a normal mood and affect. His behavior is normal. Judgment and thought content normal.   5/5 grip bilaterally  Patient constantly moving his head and shoulders to try to get some relief. No cervical or thoracic tenderness on palpation. Able to actively abduct though says he can" feel it" at  90 degrees,  can adduct and internal and external rotate without difficulty. He say "I can feel it though".on external rotation.    Assessment & Plan:  Left shoulder pain - chronic Muscle spasm left shoulder Needs to follow up with Orthopedics. Ice to the area as directed.  Meds ordered this encounter  Medications  . predniSONE (STERAPRED UNI-PAK 21 TAB) 10 MG (21) TBPK tablet    Sig: Take 6 tablets by mouth today then  5 tablets by mouth tomorrow then one less tablet everyday thereafter, take with food.    Dispense:  21 tablet    Refill:  0  . cyclobenzaprine (FLEXERIL) 10 MG tablet    Sig: Take 1 tablet (10 mg total) by mouth at bedtime.  Dispense:  15 tablet    Refill:  0  Avoid working out to rest shoulder.  He is a Warden/ranger , explained to patient if he is not feeling like he can do his job completely he is to call and we will give him a work note.He says he is scheduled to work this weekend. He states he is off next week and he will try to get an MRI scheduled.  He cannot recall the doctors names he has seen ,  he liked the one in [place]" the best."  He assures me he will find out from his wife and make an appointment.   Had to call in the prescription for the Flexeril ( called in by [facility]) , patient called and said it did not go through to his pharmacy.

## 2017-09-21 ENCOUNTER — Ambulatory Visit: Payer: Self-pay | Admitting: Family Medicine

## 2017-09-21 VITALS — BP 126/88 | HR 69 | Temp 98.4°F | Resp 16 | Ht 77.0 in | Wt 285.0 lb

## 2017-09-21 DIAGNOSIS — Z299 Encounter for prophylactic measures, unspecified: Secondary | ICD-10-CM

## 2017-09-21 NOTE — Progress Notes (Signed)
Subjective: Annual biometric screening Patient presents today for his annual biometric screening.  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  Patient is currently under the care of Dr. Candelaria Stagers, an orthopedic physician at [facility]  Patient received a steroid injection to his left shoulder 2 weeks ago.  Patient is pain-free at this time without any functional limitations.  Patient denies any limitations related to his shoulder prior to the injection.  Patient denies any other musculoskeletal complaints. Patient denies any other issues or concerns.  Patient is requesting a refill of his Adderall today.  Patient reports that he has been taking this medication for a long time and that it is well-tolerated.  Patient denies any other medical history and his only other surgical history that is a tonsillectomy as a child.  Patient does not currently follow with a primary care provider.  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  Review of Systems Constitutional: No weight change, no fever, no chills, no fatigue, no malaise.  Musculoskeletal: No arthralgias, myalgias, joint swelling, joint stiffness, back pain, neck pain. ROS otherwise negative.      Objective:   Physical Exam General: Awake, alert and oriented. No acute distress. Well developed, hydrated and nourished. Appears stated age.  HEENT: Supple neck without adenopathy. Sclera is non-icteric. The ear canal is clear without discharge. The tympanic membrane is normal in appearance with normal landmarks and cone of light. Nasal mucosa is pink and moist. Oral mucosa is pink and moist with good dentition. The pharynx is normal in appearance without tonsillar swelling or exudates.  Skin: Skin in warm, dry and intact without rashes or lesions. Appropriate color for ethnicity. Nailbeds pink with no cyanosis or clubbing. Cardiac: The external chest is normal in  appearance without lifts, heaves, or thrills. Heart rate and rhythm are normal. No murmurs, gallops, or rubs are auscultated. S1 and S2 are heard and are of normal intensity.  Respiratory: No signs of respiratory distress. Lung sounds are clear in all lobes bilaterally without rales, ronchi, or wheezes.  Abdominal: Abdomen is soft, symmetric, and non-tender without distention. Umbilicus is midline without herniation. . No masses, hepatomegaly, or splenomegaly are noted.  Spine: Neck and back are without deformity, external skin changes, or signs of trauma. Curvature of the cervical, thoracic, and lumbar spine are within normal limits. Bony features of the shoulders and hips are of equal height bilaterally. Posture is upright, gait is smooth, steady, and within normal limits.  No tenderness noted on palpation of the spinous processes. Spinous processes are midline. Cervical, thoracic, and lumbar paraspinal muscles are not tender and are without spasm. No discomfort is noted with flexion, extension, and side-to-side rotation of the cervical spine, full range of motion is noted. Full range of motion including flexion, extension, and side-to-side rotation of the thoracic and lumbar spine are noted and without discomfort. Extremities: Upper and lower extremities are atraumatic in appearance without tenderness or deformity. No swelling or erythema. Full range of motion is noted to all joints.  Steady gait noted.  Neurological: The patient is awake, alert and oriented to person, place, and time with normal speech. Memory is normal and thought process is intact. No gait abnormalities are appreciated.  Psychiatric: Appropriate mood and affect.  Assessment/Plan   Provided patient with a list of local primary care providers accepting new patients and instructed him to establish care for at least an annual  checkup with a primary care provider for regular screenings and management of ADHD.  Provided patient with a  refill of his Adderall for 1 month to allow him time to establish care with a primary care provider who will take over this responsibility.  Directed patient that we will not be refilling this medication and that this is the responsibility of his new primary care provider.  Labs pending.

## 2017-09-22 LAB — CMP12+LP+TP+TSH+6AC+PSA+CBC…
A/G RATIO: 2 (ref 1.2–2.2)
ALBUMIN: 4.7 g/dL (ref 3.5–5.5)
ALT: 20 IU/L (ref 0–44)
AST: 19 IU/L (ref 0–40)
Alkaline Phosphatase: 70 IU/L (ref 39–117)
BUN/Creatinine Ratio: 10 (ref 9–20)
BUN: 12 mg/dL (ref 6–20)
Basophils Absolute: 0 10*3/uL (ref 0.0–0.2)
Basos: 0 %
Bilirubin Total: 0.5 mg/dL (ref 0.0–1.2)
CHOL/HDL RATIO: 4 ratio (ref 0.0–5.0)
CHOLESTEROL TOTAL: 166 mg/dL (ref 100–199)
Calcium: 9.3 mg/dL (ref 8.7–10.2)
Chloride: 103 mmol/L (ref 96–106)
Creatinine, Ser: 1.17 mg/dL (ref 0.76–1.27)
EOS (ABSOLUTE): 0.3 10*3/uL (ref 0.0–0.4)
Eos: 6 %
Estimated CHD Risk: 0.8 times avg. (ref 0.0–1.0)
FREE THYROXINE INDEX: 2 (ref 1.2–4.9)
GFR calc Af Amer: 98 mL/min/{1.73_m2} (ref >59)
GFR calc non Af Amer: 84 mL/min/{1.73_m2} (ref >59)
GGT: 24 IU/L (ref 0–65)
GLOBULIN, TOTAL: 2.3 g/dL (ref 1.5–4.5)
Glucose: 94 mg/dL (ref 65–99)
HDL: 41 mg/dL (ref >39)
Hematocrit: 45.9 % (ref 37.5–51.0)
Hemoglobin: 15.3 g/dL (ref 13.0–17.7)
IMMATURE GRANS (ABS): 0 10*3/uL (ref 0.0–0.1)
IMMATURE GRANULOCYTES: 0 %
IRON: 79 ug/dL (ref 38–169)
LDH: 145 IU/L (ref 121–224)
LDL Calculated: 115 mg/dL — ABNORMAL HIGH (ref 0–99)
LYMPHS: 31 %
Lymphocytes Absolute: 1.6 10*3/uL (ref 0.7–3.1)
MCH: 28.2 pg (ref 26.6–33.0)
MCHC: 33.3 g/dL (ref 31.5–35.7)
MCV: 85 fL (ref 79–97)
MONOS ABS: 0.6 10*3/uL (ref 0.1–0.9)
Monocytes: 11 %
NEUTROS PCT: 52 %
Neutrophils Absolute: 2.7 10*3/uL (ref 1.4–7.0)
PHOSPHORUS: 3.6 mg/dL (ref 2.5–4.5)
PLATELETS: 232 10*3/uL (ref 150–379)
PROSTATE SPECIFIC AG, SERUM: 0.6 ng/mL (ref 0.0–4.0)
Potassium: 5.1 mmol/L (ref 3.5–5.2)
RBC: 5.42 x10E6/uL (ref 4.14–5.80)
RDW: 13.6 % (ref 12.3–15.4)
Sodium: 140 mmol/L (ref 134–144)
T3 UPTAKE RATIO: 30 % (ref 24–39)
T4 TOTAL: 6.5 ug/dL (ref 4.5–12.0)
TRIGLYCERIDES: 52 mg/dL (ref 0–149)
TSH: 1.46 u[IU]/mL (ref 0.450–4.500)
Total Protein: 7 g/dL (ref 6.0–8.5)
Uric Acid: 5.9 mg/dL (ref 3.7–8.6)
VLDL Cholesterol Cal: 10 mg/dL (ref 5–40)
WBC: 5.3 10*3/uL (ref 3.4–10.8)

## 2017-10-05 ENCOUNTER — Ambulatory Visit: Payer: Self-pay | Admitting: Family Medicine

## 2017-10-15 ENCOUNTER — Ambulatory Visit: Payer: Self-pay | Admitting: Family Medicine

## 2018-02-11 ENCOUNTER — Other Ambulatory Visit: Payer: Self-pay

## 2018-02-11 ENCOUNTER — Encounter
Admission: RE | Admit: 2018-02-11 | Discharge: 2018-02-11 | Disposition: A | Discharge status: Home / Self Care | Payer: Managed Care, Other (non HMO) | Source: Ambulatory Visit | Attending: Orthopedic Surgery | Admitting: Orthopedic Surgery

## 2018-02-11 HISTORY — DX: Family history of other specified conditions: Z84.89

## 2018-02-11 HISTORY — DX: Unspecified osteoarthritis, unspecified site: M19.90

## 2018-02-11 HISTORY — DX: Personal history of Methicillin resistant Staphylococcus aureus infection: Z86.14

## 2018-02-11 HISTORY — DX: Gastro-esophageal reflux disease without esophagitis: K21.9

## 2018-02-11 NOTE — Patient Instructions (Signed)
Your procedure is scheduled on: 02-15-18 FRIDAY Report to Same Day Surgery 2nd floor medical mall [facility] Entrance-take elevator on left to 2nd floor.  Check in with surgery information desk.) To find out your arrival time please call [phone] between 1PM - 3PM on 02-14-18 THURSDAY  Remember: Instructions that are not followed completely may result in serious medical risk, up to and including death, or upon the discretion of your surgeon and anesthesiologist your surgery may need to be rescheduled.    _x___ 1. Do not eat food after midnight the night before your procedure. NO GUM OR CANDY AFTER MIDNIGHT.  You may drink clear liquids up to 2 hours before you are scheduled to arrive at the hospital for your procedure.  Do not drink clear liquids within 2 hours of your scheduled arrival to the hospital.  Clear liquids include  --Water or Apple juice without pulp  --Clear carbohydrate beverage such as ClearFast or Gatorade  --Black Coffee or Clear Tea (No milk, no creamers, do not add anything to the coffee or Tea    __x__ 2. No Alcohol for 24 hours before or after surgery.   __x__3. No Smoking or e-cigarettes for 24 prior to surgery.  Do not use any chewable tobacco products for at least 6 hour prior to surgery   ____  4. Bring all medications with you on the day of surgery if instructed.    __x__ 5. Notify your doctor if there is any change in your medical condition     (cold, fever, infections).    x___6. On the morning of surgery brush your teeth with toothpaste and water.  You may rinse your mouth with mouth wash if you wish.  Do not swallow any toothpaste or mouthwash.   Do not wear jewelry, make-up, hairpins, clips or nail polish.  Do not wear lotions, powders, or perfumes. You may wear deodorant.  Do not shave 48 hours prior to surgery. Men may shave face and neck.  Do not bring valuables to the hospital.    [facility] is not responsible for any belongings or  valuables.               Contacts, dentures or bridgework may not be worn into surgery.  Leave your suitcase in the car. After surgery it may be brought to your room.  For patients admitted to the hospital, discharge time is determined by your treatment team.  _  Patients discharged the day of surgery will not be allowed to drive home.  You will need someone to drive you home and stay with you the night of your procedure.    Please read over the following fact sheets that you were given:   [facility] Preparing for Surgery and or MRSA Information   _x___ Take anti-hypertensive listed below, cardiac, seizure, asthma, anti-reflux and psychiatric medicines. These include:  1. YOU MAY TAKE HYDROCODONE DAY OF SURGERY IF NEEDED  2.  3.  4.  5.  6.  ____Fleets enema or Magnesium Citrate as directed.   _x___ Use CHG Soap or sage wipes as directed on instruction sheet   ____ Use inhalers on the day of surgery and bring to hospital day of surgery  ____ Stop Metformin and Janumet 2 days prior to surgery.    ____ Take 1/2 of usual insulin dose the night before surgery and none on the morning surgery.   ____ Follow recommendations from Cardiologist, Pulmonologist or PCP regarding stopping Aspirin, Coumadin, Plavix ,Eliquis,  Effient, or Pradaxa, and Pletal.  X____Stop Anti-inflammatories such as Advil, Aleve, Ibuprofen, Motrin, Naproxen, Naprosyn, Goodies powders or aspirin products NOW-OK to take Tylenol OR HYDROCODONE IF NEEDED   ____ Stop supplements until after surgery.     ____ Bring C-Pap to the hospital.

## 2018-02-12 ENCOUNTER — Encounter
Admission: RE | Admit: 2018-02-12 | Discharge: 2018-02-12 | Disposition: A | Discharge status: Home / Self Care | Payer: Managed Care, Other (non HMO) | Source: Ambulatory Visit | Attending: Orthopedic Surgery | Admitting: Orthopedic Surgery

## 2018-02-12 DIAGNOSIS — E669 Obesity, unspecified: Secondary | ICD-10-CM | POA: Diagnosis not present

## 2018-02-12 DIAGNOSIS — M75102 Unspecified rotator cuff tear or rupture of left shoulder, not specified as traumatic: Secondary | ICD-10-CM | POA: Diagnosis present

## 2018-02-12 DIAGNOSIS — S43432A Superior glenoid labrum lesion of left shoulder, initial encounter: Secondary | ICD-10-CM | POA: Diagnosis not present

## 2018-02-12 DIAGNOSIS — Z8379 Family history of other diseases of the digestive system: Secondary | ICD-10-CM | POA: Diagnosis not present

## 2018-02-12 DIAGNOSIS — F909 Attention-deficit hyperactivity disorder, unspecified type: Secondary | ICD-10-CM | POA: Diagnosis not present

## 2018-02-12 DIAGNOSIS — M19012 Primary osteoarthritis, left shoulder: Secondary | ICD-10-CM | POA: Diagnosis not present

## 2018-02-12 DIAGNOSIS — X58XXXA Exposure to other specified factors, initial encounter: Secondary | ICD-10-CM | POA: Diagnosis not present

## 2018-02-12 DIAGNOSIS — Z01812 Encounter for preprocedural laboratory examination: Secondary | ICD-10-CM

## 2018-02-12 DIAGNOSIS — Z79899 Other long term (current) drug therapy: Secondary | ICD-10-CM | POA: Diagnosis not present

## 2018-02-12 DIAGNOSIS — Z7982 Long term (current) use of aspirin: Secondary | ICD-10-CM | POA: Diagnosis not present

## 2018-02-12 DIAGNOSIS — Z791 Long term (current) use of non-steroidal anti-inflammatories (NSAID): Secondary | ICD-10-CM | POA: Diagnosis not present

## 2018-02-12 DIAGNOSIS — Z6832 Body mass index (BMI) 32.0-32.9, adult: Secondary | ICD-10-CM | POA: Diagnosis not present

## 2018-02-12 DIAGNOSIS — Z880 Allergy status to penicillin: Secondary | ICD-10-CM | POA: Diagnosis not present

## 2018-02-12 DIAGNOSIS — Z8661 Personal history of infections of the central nervous system: Secondary | ICD-10-CM | POA: Diagnosis not present

## 2018-02-12 DIAGNOSIS — Z8249 Family history of ischemic heart disease and other diseases of the circulatory system: Secondary | ICD-10-CM | POA: Diagnosis not present

## 2018-02-12 DIAGNOSIS — Z8489 Family history of other specified conditions: Secondary | ICD-10-CM | POA: Diagnosis not present

## 2018-02-12 DIAGNOSIS — Z8614 Personal history of Methicillin resistant Staphylococcus aureus infection: Secondary | ICD-10-CM | POA: Diagnosis not present

## 2018-02-12 LAB — SURGICAL PCR SCREEN
MRSA, PCR: POSITIVE — AB
STAPHYLOCOCCUS AUREUS: POSITIVE — AB

## 2018-02-13 NOTE — Pre-Procedure Instructions (Signed)
TRIED TO FAX + MRSA/STAPH RESULTS TO CASEY AT DR PATELS OFFICE AND FAX KEEPS SAYING BUSY-I CALLED AND SPOKE WITH CASEY AND INFORMED HER OF THE + MRSA AND STAPH-I ASKED HER IF DR PATEL WANTS TO ORDER ADDITIONAL ANTIBIOTIC TO THE PREOP CLINDAMYCIN- SHE SPOKE WITH DR PATEL AND HE WANTS TO ADD VANCOMYCIN 1.5 GM IV

## 2018-02-14 ENCOUNTER — Encounter: Payer: Self-pay | Admitting: *Deleted

## 2018-02-14 MED ORDER — CLINDAMYCIN PHOSPHATE 900 MG/50ML IV SOLN
900.0000 mg | Freq: Once | INTRAVENOUS | Status: AC
  Administered 2018-02-15: 900 mg via INTRAVENOUS

## 2018-02-14 MED ORDER — VANCOMYCIN HCL 10 G IV SOLR
1500.0000 mg | Freq: Once | INTRAVENOUS | Status: AC
  Administered 2018-02-15: 1500 mg via INTRAVENOUS
  Filled 2018-02-14: qty 1500

## 2018-02-15 ENCOUNTER — Encounter
Admission: RE | Disposition: A | Discharge status: Home / Self Care | Payer: Self-pay | Source: Ambulatory Visit | Attending: Orthopedic Surgery

## 2018-02-15 ENCOUNTER — Ambulatory Visit: Payer: Managed Care, Other (non HMO) | Admitting: Certified Registered Nurse Anesthetist

## 2018-02-15 ENCOUNTER — Other Ambulatory Visit: Payer: Self-pay

## 2018-02-15 ENCOUNTER — Ambulatory Visit
Admission: RE | Admit: 2018-02-15 | Discharge: 2018-02-15 | Disposition: A | Discharge status: Home / Self Care | Payer: Managed Care, Other (non HMO) | Source: Ambulatory Visit | Attending: Orthopedic Surgery | Admitting: Orthopedic Surgery

## 2018-02-15 DIAGNOSIS — M19012 Primary osteoarthritis, left shoulder: Secondary | ICD-10-CM | POA: Insufficient documentation

## 2018-02-15 DIAGNOSIS — S43432A Superior glenoid labrum lesion of left shoulder, initial encounter: Secondary | ICD-10-CM | POA: Insufficient documentation

## 2018-02-15 DIAGNOSIS — M75102 Unspecified rotator cuff tear or rupture of left shoulder, not specified as traumatic: Secondary | ICD-10-CM | POA: Insufficient documentation

## 2018-02-15 DIAGNOSIS — Z8614 Personal history of Methicillin resistant Staphylococcus aureus infection: Secondary | ICD-10-CM | POA: Insufficient documentation

## 2018-02-15 DIAGNOSIS — Z79899 Other long term (current) drug therapy: Secondary | ICD-10-CM | POA: Insufficient documentation

## 2018-02-15 DIAGNOSIS — Z7982 Long term (current) use of aspirin: Secondary | ICD-10-CM | POA: Insufficient documentation

## 2018-02-15 DIAGNOSIS — Z8661 Personal history of infections of the central nervous system: Secondary | ICD-10-CM | POA: Insufficient documentation

## 2018-02-15 DIAGNOSIS — Z791 Long term (current) use of non-steroidal anti-inflammatories (NSAID): Secondary | ICD-10-CM | POA: Insufficient documentation

## 2018-02-15 DIAGNOSIS — E669 Obesity, unspecified: Secondary | ICD-10-CM | POA: Insufficient documentation

## 2018-02-15 DIAGNOSIS — Z8489 Family history of other specified conditions: Secondary | ICD-10-CM | POA: Insufficient documentation

## 2018-02-15 DIAGNOSIS — Z8249 Family history of ischemic heart disease and other diseases of the circulatory system: Secondary | ICD-10-CM | POA: Insufficient documentation

## 2018-02-15 DIAGNOSIS — Z8379 Family history of other diseases of the digestive system: Secondary | ICD-10-CM | POA: Insufficient documentation

## 2018-02-15 DIAGNOSIS — Z6832 Body mass index (BMI) 32.0-32.9, adult: Secondary | ICD-10-CM | POA: Insufficient documentation

## 2018-02-15 DIAGNOSIS — Z880 Allergy status to penicillin: Secondary | ICD-10-CM | POA: Insufficient documentation

## 2018-02-15 DIAGNOSIS — F909 Attention-deficit hyperactivity disorder, unspecified type: Secondary | ICD-10-CM | POA: Insufficient documentation

## 2018-02-15 DIAGNOSIS — X58XXXA Exposure to other specified factors, initial encounter: Secondary | ICD-10-CM | POA: Insufficient documentation

## 2018-02-15 HISTORY — PX: SHOULDER ARTHROSCOPY WITH LABRAL REPAIR: SHX5691

## 2018-02-15 SURGERY — ARTHROSCOPY, SHOULDER, WITH GLENOID LABRUM REPAIR
Anesthesia: General | Site: Shoulder | Laterality: Left | Wound class: Clean

## 2018-02-15 MED ORDER — ACETAMINOPHEN 10 MG/ML IV SOLN
INTRAVENOUS | Status: DC | PRN
  Administered 2018-02-15: 1000 mg via INTRAVENOUS

## 2018-02-15 MED ORDER — ROCURONIUM BROMIDE 50 MG/5ML IV SOLN
INTRAVENOUS | Status: AC
  Filled 2018-02-15: qty 1

## 2018-02-15 MED ORDER — ONDANSETRON HCL 4 MG/2ML IJ SOLN
INTRAMUSCULAR | Status: AC
  Filled 2018-02-15: qty 2

## 2018-02-15 MED ORDER — PROPOFOL 10 MG/ML IV BOLUS
INTRAVENOUS | Status: AC
  Filled 2018-02-15: qty 40

## 2018-02-15 MED ORDER — FENTANYL CITRATE (PF) 100 MCG/2ML IJ SOLN
INTRAMUSCULAR | Status: DC | PRN
  Administered 2018-02-15 (×2): 50 ug via INTRAVENOUS
  Administered 2018-02-15: 100 ug via INTRAVENOUS
  Administered 2018-02-15 (×2): 50 ug via INTRAVENOUS

## 2018-02-15 MED ORDER — BUPIVACAINE LIPOSOME 1.3 % IJ SUSP
INTRAMUSCULAR | Status: AC
  Filled 2018-02-15: qty 20

## 2018-02-15 MED ORDER — FAMOTIDINE 20 MG PO TABS
ORAL_TABLET | ORAL | Status: AC
  Administered 2018-02-15: 20 mg via ORAL
  Filled 2018-02-15: qty 1

## 2018-02-15 MED ORDER — GLYCOPYRROLATE 0.2 MG/ML IJ SOLN
INTRAMUSCULAR | Status: AC
  Filled 2018-02-15: qty 1

## 2018-02-15 MED ORDER — LACTATED RINGERS IV SOLN
INTRAVENOUS | Status: DC | PRN
  Administered 2018-02-15: 06:00:00 via INTRAVENOUS

## 2018-02-15 MED ORDER — LIDOCAINE HCL (PF) 1 % IJ SOLN
INTRAMUSCULAR | Status: DC | PRN
  Administered 2018-02-15: 3 mL

## 2018-02-15 MED ORDER — FENTANYL CITRATE (PF) 100 MCG/2ML IJ SOLN
INTRAMUSCULAR | Status: AC
Start: 2018-02-15 — End: ?
  Filled 2018-02-15: qty 2

## 2018-02-15 MED ORDER — FENTANYL CITRATE (PF) 100 MCG/2ML IJ SOLN
INTRAMUSCULAR | Status: AC
  Filled 2018-02-15: qty 2

## 2018-02-15 MED ORDER — SUGAMMADEX SODIUM 500 MG/5ML IV SOLN
INTRAVENOUS | Status: AC
  Filled 2018-02-15: qty 5

## 2018-02-15 MED ORDER — CLINDAMYCIN PHOSPHATE 900 MG/50ML IV SOLN
INTRAVENOUS | Status: AC
  Filled 2018-02-15: qty 50

## 2018-02-15 MED ORDER — EPINEPHRINE PF 1 MG/ML IJ SOLN
INTRAMUSCULAR | Status: DC | PRN
  Administered 2018-02-15: 5 mL

## 2018-02-15 MED ORDER — SUGAMMADEX SODIUM 500 MG/5ML IV SOLN
INTRAVENOUS | Status: DC | PRN
  Administered 2018-02-15: 494 mg via INTRAVENOUS

## 2018-02-15 MED ORDER — LIDOCAINE HCL (CARDIAC) PF 100 MG/5ML IV SOSY
PREFILLED_SYRINGE | INTRAVENOUS | Status: DC | PRN
  Administered 2018-02-15: 100 mg via INTRAVENOUS

## 2018-02-15 MED ORDER — MIDAZOLAM HCL 2 MG/2ML IJ SOLN
INTRAMUSCULAR | Status: AC
  Administered 2018-02-15: 2 mg via INTRAVENOUS
  Filled 2018-02-15: qty 2

## 2018-02-15 MED ORDER — FENTANYL CITRATE (PF) 100 MCG/2ML IJ SOLN
50.0000 ug | Freq: Once | INTRAMUSCULAR | Status: AC
  Administered 2018-02-15: 50 ug via INTRAVENOUS

## 2018-02-15 MED ORDER — FENTANYL CITRATE (PF) 100 MCG/2ML IJ SOLN
INTRAMUSCULAR | Status: AC
  Administered 2018-02-15: 50 ug via INTRAVENOUS
  Filled 2018-02-15: qty 2

## 2018-02-15 MED ORDER — ROCURONIUM BROMIDE 100 MG/10ML IV SOLN
INTRAVENOUS | Status: DC | PRN
  Administered 2018-02-15: 20 mg via INTRAVENOUS
  Administered 2018-02-15: 50 mg via INTRAVENOUS
  Administered 2018-02-15: 30 mg via INTRAVENOUS
  Administered 2018-02-15: 50 mg via INTRAVENOUS
  Administered 2018-02-15: 20 mg via INTRAVENOUS
  Administered 2018-02-15: 30 mg via INTRAVENOUS

## 2018-02-15 MED ORDER — LIDOCAINE HCL (PF) 1 % IJ SOLN
INTRAMUSCULAR | Status: AC
  Filled 2018-02-15: qty 30

## 2018-02-15 MED ORDER — PHENYLEPHRINE HCL 10 MG/ML IJ SOLN
INTRAMUSCULAR | Status: AC
  Filled 2018-02-15: qty 1

## 2018-02-15 MED ORDER — ONDANSETRON HCL 4 MG/2ML IJ SOLN
INTRAMUSCULAR | Status: DC | PRN
  Administered 2018-02-15: 4 mg via INTRAVENOUS

## 2018-02-15 MED ORDER — DEXAMETHASONE SODIUM PHOSPHATE 10 MG/ML IJ SOLN
INTRAMUSCULAR | Status: DC | PRN
  Administered 2018-02-15: 10 mg via INTRAVENOUS

## 2018-02-15 MED ORDER — DEXAMETHASONE SODIUM PHOSPHATE 10 MG/ML IJ SOLN
INTRAMUSCULAR | Status: AC
  Filled 2018-02-15: qty 1

## 2018-02-15 MED ORDER — MIDAZOLAM HCL 2 MG/2ML IJ SOLN
2.0000 mg | Freq: Once | INTRAMUSCULAR | Status: AC
  Administered 2018-02-15: 2 mg via INTRAVENOUS

## 2018-02-15 MED ORDER — SUCCINYLCHOLINE CHLORIDE 20 MG/ML IJ SOLN
INTRAMUSCULAR | Status: AC
Start: 2018-02-15 — End: ?
  Filled 2018-02-15: qty 1

## 2018-02-15 MED ORDER — LIDOCAINE HCL (PF) 1 % IJ SOLN
INTRAMUSCULAR | Status: AC
  Filled 2018-02-15: qty 5

## 2018-02-15 MED ORDER — FENTANYL CITRATE (PF) 100 MCG/2ML IJ SOLN: 25.0000 ug | INTRAMUSCULAR | Status: DC | PRN

## 2018-02-15 MED ORDER — LACTATED RINGERS IV SOLN: INTRAVENOUS | Status: DC

## 2018-02-15 MED ORDER — BUPIVACAINE HCL (PF) 0.5 % IJ SOLN
INTRAMUSCULAR | Status: AC
  Filled 2018-02-15: qty 10

## 2018-02-15 MED ORDER — BUPIVACAINE-EPINEPHRINE 0.5% -1:200000 IJ SOLN
INTRAMUSCULAR | Status: DC | PRN
  Administered 2018-02-15: 10 mL

## 2018-02-15 MED ORDER — LIDOCAINE HCL (PF) 2 % IJ SOLN
INTRAMUSCULAR | Status: AC
  Filled 2018-02-15: qty 10

## 2018-02-15 MED ORDER — ACETAMINOPHEN 10 MG/ML IV SOLN
INTRAVENOUS | Status: AC
  Filled 2018-02-15: qty 100

## 2018-02-15 MED ORDER — BUPIVACAINE-EPINEPHRINE (PF) 0.5% -1:200000 IJ SOLN
INTRAMUSCULAR | Status: AC
  Filled 2018-02-15: qty 30

## 2018-02-15 MED ORDER — PROPOFOL 10 MG/ML IV BOLUS
INTRAVENOUS | Status: DC | PRN
  Administered 2018-02-15: 250 mg via INTRAVENOUS

## 2018-02-15 MED ORDER — BUPIVACAINE LIPOSOME 1.3 % IJ SUSP
INTRAMUSCULAR | Status: DC | PRN
  Administered 2018-02-15: 20 mL via PERINEURAL

## 2018-02-15 MED ORDER — EPINEPHRINE 30 MG/30ML IJ SOLN
INTRAMUSCULAR | Status: AC
  Filled 2018-02-15: qty 1

## 2018-02-15 MED ORDER — FAMOTIDINE 20 MG PO TABS
20.0000 mg | ORAL_TABLET | Freq: Once | ORAL | Status: AC
  Administered 2018-02-15: 20 mg via ORAL

## 2018-02-15 MED ORDER — OXYCODONE HCL 5 MG/5ML PO SOLN: 5.0000 mg | Freq: Once | ORAL | Status: DC | PRN

## 2018-02-15 MED ORDER — PROMETHAZINE HCL 25 MG/ML IJ SOLN: 6.2500 mg | INTRAMUSCULAR | Status: DC | PRN

## 2018-02-15 MED ORDER — BUPIVACAINE HCL (PF) 0.5 % IJ SOLN
INTRAMUSCULAR | Status: DC | PRN
  Administered 2018-02-15: 10 mL via PERINEURAL

## 2018-02-15 MED ORDER — ASPIRIN EC 325 MG PO TBEC: 325.0000 mg | DELAYED_RELEASE_TABLET | Freq: Every day | ORAL | 0 refills | Status: AC

## 2018-02-15 MED ORDER — MEPERIDINE HCL 50 MG/ML IJ SOLN: 6.2500 mg | INTRAMUSCULAR | Status: DC | PRN

## 2018-02-15 MED ORDER — ONDANSETRON 4 MG PO TBDP: 4.0000 mg | ORAL_TABLET | Freq: Three times a day (TID) | ORAL | 0 refills | Status: AC | PRN

## 2018-02-15 MED ORDER — OXYCODONE HCL 5 MG PO TABS: 5.0000 mg | ORAL_TABLET | ORAL | 0 refills | Status: AC | PRN

## 2018-02-15 MED ORDER — ACETAMINOPHEN 500 MG PO TABS: 1000.0000 mg | ORAL_TABLET | Freq: Three times a day (TID) | ORAL | 2 refills | Status: AC

## 2018-02-15 MED ORDER — OXYCODONE HCL 5 MG PO TABS: 5.0000 mg | ORAL_TABLET | Freq: Once | ORAL | Status: DC | PRN

## 2018-02-15 SURGICAL SUPPLY — 72 items
ADAPTER IRRIG TUBE 2 SPIKE SOL (ADAPTER) ×8 IMPLANT
ADPR TBG 2 SPK PMP STRL ASCP (ADAPTER) ×4
ANCH SUT 1.3 FBRTK 2LD BLU WHT (Anchor) ×2 IMPLANT
ANCH SUT SHRT 12.5 CANN EYLT (Anchor) ×4 IMPLANT
ANCHOR SUT BIOCOMP LK 2.9X12.5 (Anchor) ×6 IMPLANT
ANCHOR SUT FBRTK SUTURETAP 1.3 (Anchor) ×3 IMPLANT
BIT DRILL RIGD1.8MM FBRTK STRL (DRILL) ×1 IMPLANT
BRUSH SCRUB EZ  4% CHG (MISCELLANEOUS) ×2
BRUSH SCRUB EZ 4% CHG (MISCELLANEOUS) ×2 IMPLANT
BUR RADIUS 4.0X18.5 (BURR) ×1 IMPLANT
BUR RADIUS 5.5 (BURR) ×1 IMPLANT
CANNULA 5.75X7 CRYSTAL CLEAR (CANNULA) ×2 IMPLANT
CANNULA 5.75X7CM (CANNULA) ×2
CANNULA PART THRD DISP 5.75X7 (CANNULA) ×4 IMPLANT
CANNULA PARTIAL THREAD 2X7 (CANNULA) ×4 IMPLANT
CANNULA TWIST IN 8.25X9CM (CANNULA) IMPLANT
CHLORAPREP W/TINT 26ML (MISCELLANEOUS) ×4 IMPLANT
CLOSURE WOUND 1/2 X4 (GAUZE/BANDAGES/DRESSINGS) ×1
COOLER POLAR GLACIER W/PUMP (MISCELLANEOUS) ×4 IMPLANT
CRADLE LAMINECT ARM (MISCELLANEOUS) ×4 IMPLANT
CUTTER BONE 4.0MM X 13CM (MISCELLANEOUS) ×6 IMPLANT
DRAPE IMP U-DRAPE 54X76 (DRAPES) ×8 IMPLANT
DRAPE INCISE IOBAN 66X45 STRL (DRAPES) ×4 IMPLANT
DRAPE SHEET LG 3/4 BI-LAMINATE (DRAPES) ×4 IMPLANT
DRILL RIGID 1.8MM FBRTK STRL (DRILL) ×4
ELECT REM PT RETURN 9FT ADLT (ELECTROSURGICAL)
ELECTRODE REM PT RTRN 9FT ADLT (ELECTROSURGICAL) IMPLANT
GAUZE PETRO XEROFOAM 1X8 (MISCELLANEOUS) ×4 IMPLANT
GAUZE SPONGE 4X4 12PLY STRL (GAUZE/BANDAGES/DRESSINGS) ×4 IMPLANT
GLOVE BIOGEL PI IND STRL 8 (GLOVE) ×2 IMPLANT
GLOVE BIOGEL PI INDICATOR 8 (GLOVE) ×2
GLOVE SURG SYN 7.5  E (GLOVE) ×2
GLOVE SURG SYN 7.5 E (GLOVE) ×2 IMPLANT
GLOVE SURG SYN 7.5 PF PI (GLOVE) ×1 IMPLANT
GOWN STRL REUS W/ TWL LRG LVL3 (GOWN DISPOSABLE) ×2 IMPLANT
GOWN STRL REUS W/TWL LRG LVL3 (GOWN DISPOSABLE) ×4
GOWN STRL REUS W/TWL LRG LVL4 (GOWN DISPOSABLE) ×4 IMPLANT
IV LACTATED RINGER IRRG 3000ML (IV SOLUTION) ×20
IV LR IRRIG 3000ML ARTHROMATIC (IV SOLUTION) ×25 IMPLANT
KIT INSERTION 2.9 PUSHLOCK (KITS) ×4 IMPLANT
KIT SUTURETAK 3.0 INSERT PERC (KITS) ×4 IMPLANT
KIT TURNOVER KIT A (KITS) ×4 IMPLANT
LASSO 25 DEG QUICKPASS CVD LT (SUTURE) ×3 IMPLANT
MANIFOLD NEPTUNE II (INSTRUMENTS) ×4 IMPLANT
MAT BLUE FLOOR 46X72 FLO (MISCELLANEOUS) ×8 IMPLANT
NDL SAFETY ECLIPSE 18X1.5 (NEEDLE) ×2 IMPLANT
NEEDLE HYPO 18GX1.5 SHARP (NEEDLE) ×4
NEEDLE HYPO 22GX1.5 SAFETY (NEEDLE) ×4 IMPLANT
PACK ARTHROSCOPY SHOULDER (MISCELLANEOUS) ×4 IMPLANT
PAD ABD DERMACEA PRESS 5X9 (GAUZE/BANDAGES/DRESSINGS) ×4 IMPLANT
PAD WRAPON POLAR SHDR XLG (MISCELLANEOUS) ×2 IMPLANT
PORT APPOLLO RF 90DEGREE MULTI (SURGICAL WAND) ×4 IMPLANT
SET TUBE SUCT SHAVER OUTFL 24K (TUBING) ×1 IMPLANT
SET TUBE TIP INTRA-ARTICULAR (MISCELLANEOUS) IMPLANT
SLING ULTRA II M (MISCELLANEOUS) ×4 IMPLANT
STRAP SAFETY 5IN WIDE (MISCELLANEOUS) ×4 IMPLANT
STRIP CLOSURE SKIN 1/2X4 (GAUZE/BANDAGES/DRESSINGS) ×3 IMPLANT
SUT ETHILON 4-0 (SUTURE) ×4
SUT ETHILON 4-0 FS2 18XMFL BLK (SUTURE) ×2
SUT LASSO 90 DEG SD STR (SUTURE) ×4 IMPLANT
SUT MNCRL 4-0 (SUTURE) ×4
SUT MNCRL 4-0 27XMFL (SUTURE) ×2
SUTURE ETHLN 4-0 FS2 18XMF BLK (SUTURE) ×2 IMPLANT
SUTURE MNCRL 4-0 27XMF (SUTURE) ×2 IMPLANT
SYR 10ML LL (SYRINGE) ×4 IMPLANT
TAPE CLOTH 3X10 WHT NS LF (GAUZE/BANDAGES/DRESSINGS) ×4 IMPLANT
TAPE MICROFOAM 4IN (TAPE) ×4 IMPLANT
TUBING ARTHRO INFLOW-ONLY STRL (TUBING) IMPLANT
TUBING CONNECTING 10 (TUBING) ×1 IMPLANT
TUBING CONNECTING 10' (TUBING)
WAND HAND CNTRL MULTIVAC 90 (MISCELLANEOUS) IMPLANT
WRAPON POLAR PAD SHDR XLG (MISCELLANEOUS) ×4

## 2018-02-15 NOTE — Discharge Instructions (Signed)
Post-Op Instructions  1. Bracing: You will wear a shoulder immobilizer or sling for at least 4 weeks. Total time will be determined by type of surgical repair and can be discussed at 1st postop appointment.  2. Driving: No driving for 4 weeks post-op. When driving, do not wear the immobilizer.  3. Activity: No active lifting for 2 months. Wrist, hand, and elbow motion only. Avoid lifting the upper arm away from the body except for hygiene. You are permitted to bend and straighten the elbow actively. You may use your hand and wrist for typing, writing, and managing utensils (cutting food). Do not lift more than a coffee cup for 8 weeks.  When sleeping or resting, inclined positions (recliner chair or wedge pillow) and a pillow under the forearm for support may provide better comfort for up to 4 weeks.  Avoid long distance travel for 4 weeks.  Return to normal activities after labral repair normally takes 6 months on average. If rehab goes very well, may be able to do most activities at 4 months, except overhead or contact sports.  4. Physical Therapy: Begins 3-4 days after surgery, and proceeds 2 times per week for the first 4 weeks, then 1-2 times per week from weeks 4-8 post-op.  5. Medications:  - You will be provided a prescription for narcotic pain medicine. After surgery, take 1-2 narcotic tablets every 4 hours if needed for severe pain.  - A prescription for anti-nausea medication will be provided in case the narcotic medicine causes nausea - take 1 tablet every 6 hours only if nauseated.   - Take tylenol 1000 mg every 8 hours for pain.  May stop tylenol 3 days after surgery if you are having minimal pain.  If you are taking prescription medication for anxiety, depression, insomnia, muscle spasm, chronic pain, or for attention deficit disorder, you are advised that you are at a higher risk of adverse effects with use of narcotics post-op, including narcotic addiction/dependence, depressed  breathing, death. If you use non-prescribed substances: alcohol, marijuana, cocaine, heroin, methamphetamines, etc., you are at a higher risk of adverse effects with use of narcotics post-op, including narcotic addiction/dependence, depressed breathing, death. You are advised that taking > 50 morphine milligram equivalents (MME) of narcotic pain medication per day results in twice the risk of overdose or death. For your prescription provided: oxycodone 5 mg - taking more than 6 tablets per day would result in > 50 morphine milligram equivalents (MME) of narcotic pain medication. Be advised that we will prescribe narcotics short-term, for acute post-operative pain only - 3 weeks for major operations such as shoulder repair/reconstruction surgeries.   6. Post-Op Appointment:  Your first post-op appointment will be 10-14 days post-op.  7. Work or School: For most, but not all procedures, we advise staying out of work or school for at least 1 to 2 weeks in order to recover from the stress of surgery and to allow time for healing.   If you need a work or school note this can be provided.   Post-operative Brace: Apply and remove the brace you received as you were instructed to at the time of fitting and as described in detail as the braces instructions for use indicate.  Wear the brace for the period of time prescribed by your physician.  The brace can be cleaned with soap and water and allowed to air dry only.  Should the brace result in increased pain, decreased feeling (numbness/tingling), increased swelling or an overall worsening  of your medical condition, please contact your doctor immediately.  If an emergency situation occurs as a result of wearing the brace after normal business hours, please dial 911 and seek immediate medical attention.  Let your doctor know if you have any further questions about the brace issued to you. Refer to the shoulder sling instructions for use if you have any questions  regarding the correct fit of your shoulder sling.  [facility]bles[facility]ooting: [phone]  Video that illustrates how to properly use a shoulder sling: "Instructions for Proper Use of an Orthopaedic Sling" [email or web address]           Interscalene Nerve Block with Exparel  1.  For your surgery you have received an Interscalene Nerve Block with Exparel. 2. Nerve Blocks affect many types of nerves, including nerves that control movement, pain and normal sensation.  You may experience feelings such as numbness, tingling, heaviness, weakness or the inability to move your arm or the feeling or sensation that your arm has "fallen asleep". 3. A nerve block with Exparel can last up to 5 days.  Usually the weakness wears off first.  The tingling and heaviness usually wear off next.  Finally you may start to notice pain.  Keep in mind that this may occur in any order.  Once a nerve block starts to wear off it is usually completely gone within 60 minutes. 4. ISNB may cause mild shortness of breath, a hoarse voice, blurry vision, unequal pupils, or drooping of the face on the same side as the nerve block.  These symptoms will usually resolve with the numbness.  Very rarely the procedure itself can cause mild seizures. 5. If needed, your surgeon will give you a prescription for pain medication.  It will take about 60 minutes for the oral pain medication to become fully effective.  So, it is recommended that you start taking this medication before the nerve block first begins to wear off, or when you first begin to feel discomfort. 6. Take your pain medication only as prescribed.  Pain medication can cause sedation and decrease your breathing if you take more than you need for the level of pain that you have. 7. Nausea is a common side effect of many pain medications.  You may want to eat something before taking your pain medicine to prevent nausea. 8. After an  Interscalene nerve block, you cannot feel pain, pressure or extremes in temperature in the effected arm.  Because your arm is numb it is at an increased risk for injury.  To decrease the possibility of injury, please practice the following:  a. While you are awake change the position of your arm frequently to prevent too much pressure on any one area for prolonged periods of time. b.  If you have a cast or tight dressing, check the color or your fingers every couple of hours.  Call your surgeon with the appearance of any discoloration (white or blue). c. If you are given a sling to wear before you go home, please wear it  at all times until the block has completely worn off.  Do not get up at night without your sling. d. Please contact [facility] or your surgeon if you do not begin to regain sensation after 7 days from the surgery.  Anesthesia may be contacted by calling the Same Day Surgery Department, Mon. through Fri., 6 am to 4 pm at [phone].   e. If you experience any other problems or concerns,  please contact your surgeon's office. f. If you experience severe or prolonged shortness of breath go to the nearest emergency department.  AMBULATORY SURGERY  DISCHARGE INSTRUCTIONS   1) The drugs that you were given will stay in your system until tomorrow so for the next 24 hours you should not:  A) Drive an automobile B) Make any legal decisions C) Drink any alcoholic beverage   2) You may resume regular meals tomorrow.  Today it is better to start with liquids and gradually work up to solid foods.  You may eat anything you prefer, but it is better to start with liquids, then soup and crackers, and gradually work up to solid foods.   3) Please notify your doctor immediately if you have any unusual bleeding, trouble breathing, redness and pain at the surgery site, drainage, fever, or pain not relieved by medication.    4) Additional Instructions:        Please contact  your physician with any problems or Same Day Surgery at [phone], Monday through Friday 6 am to 4 pm, or [place] at [facility] number at [phone].

## 2018-02-15 NOTE — Anesthesia Preprocedure Evaluation (Signed)
Anesthesia Evaluation  Patient identified by MRN, date of birth, ID band Patient awake    Reviewed: Allergy & Precautions, NPO status , Patient's Chart, lab work & pertinent test results  History of Anesthesia Complications (+) PONVNegative for: history of anesthetic complications  Airway Mallampati: I  TM Distance: >3 FB Neck ROM: Full    Dental no notable dental hx.    Pulmonary neg sleep apnea, neg COPD, former smoker,    breath sounds clear to auscultation- rhonchi (-) wheezing      Cardiovascular Exercise Tolerance: Good (-) hypertension(-) CAD, (-) Past MI, (-) Cardiac Stents and (-) CABG  Rhythm:Regular Rate:Normal - Systolic murmurs and - Diastolic murmurs    Neuro/Psych PSYCHIATRIC DISORDERS (ADD) negative neurological ROS     GI/Hepatic Neg liver ROS, GERD  ,  Endo/Other  negative endocrine ROSneg diabetes  Renal/GU negative Renal ROS     Musculoskeletal  (+) Arthritis ,   Abdominal (+) + obese,   Peds  Hematology negative hematology ROS (+)   Anesthesia Other Findings Past Medical History: No date: ADD (attention deficit disorder) No date: Arthritis     Comment:  LEFT SHOULDER No date: Attention deficit disorder (ADD) in adult No date: Family history of adverse reaction to anesthesia     Comment:  UNCLES-VIOLENT AFTER SURGERY No date: GERD (gastroesophageal reflux disease)     Comment:  H/O 2012: History of methicillin resistant staphylococcus aureus (MRSA) No date: Meningitis   Reproductive/Obstetrics                             Anesthesia Physical Anesthesia Plan  ASA: II  Anesthesia Plan: General   Post-op Pain Management:  Regional for Post-op pain   Induction: Intravenous  PONV Risk Score and Plan: 2 and Ondansetron, Dexamethasone and Midazolam  Airway Management Planned: Oral ETT  Additional Equipment:   Intra-op Plan:   Post-operative Plan:  Extubation in OR  Informed Consent: I have reviewed the patients History and Physical, chart, labs and discussed the procedure including the risks, benefits and alternatives for the proposed anesthesia with the patient or authorized representative who has indicated his/her understanding and acceptance.   Dental advisory given  Plan Discussed with: CRNA and Anesthesiologist  Anesthesia Plan Comments:         Anesthesia Quick Evaluation

## 2018-02-15 NOTE — Anesthesia Procedure Notes (Signed)
Procedure Name: Intubation Performed by: Demetrius Charity, CRNA Pre-anesthesia Checklist: Patient identified, Patient being monitored, Timeout performed, Emergency Drugs available and Suction available Patient Re-evaluated:Patient Re-evaluated prior to induction Oxygen Delivery Method: Circle system utilized Preoxygenation: Pre-oxygenation with 100% oxygen Induction Type: IV induction Ventilation: Mask ventilation without difficulty and Oral airway inserted - appropriate to patient size Laryngoscope Size: Mac and 4 Grade View: Grade I Tube type: Oral Tube size: 7.0 mm Number of attempts: 1 Airway Equipment and Method: Stylet Placement Confirmation: ETT inserted through vocal cords under direct vision,  positive ETCO2 and breath sounds checked- equal and bilateral Secured at: 25 cm Tube secured with: Tape Dental Injury: Teeth and Oropharynx as per pre-operative assessment

## 2018-02-15 NOTE — Anesthesia Postprocedure Evaluation (Signed)
Anesthesia Post Note  Patient: Nicholas Cook  Procedure(s) Performed: SHOULDER ARTHROSCOPY WITH POSTERIOR LABRAL REPAIR, EXTENSIVE DEBRIDEMENT, BICEPTS TENODESIS, DECOMPRESSION LABRAL CYST (Left Shoulder)  Patient location during evaluation: PACU Anesthesia Type: General Level of consciousness: awake and alert and oriented Pain management: pain level controlled Vital Signs Assessment: post-procedure vital signs reviewed and stable Respiratory status: spontaneous breathing, nonlabored ventilation and respiratory function stable Cardiovascular status: blood pressure returned to baseline and stable Postop Assessment: no signs of nausea or vomiting Anesthetic complications: no     Last Vitals:  Vitals:   02/15/18 1312 02/15/18 1356  BP: 132/84 122/74  Pulse: 86 73  Resp: 16   Temp: (!) 35.8 C   SpO2: 95% 95%    Last Pain:  Vitals:   02/15/18 1356  TempSrc:   PainSc: 0-No pain                 Vipul Cafarelli

## 2018-02-15 NOTE — H&P (Signed)
Paper H&P to be scanned into permanent record. H&P reviewed. No significant changes noted.  

## 2018-02-15 NOTE — Progress Notes (Signed)
Capillary refill positive to left hand   Can wiggle fingers on left

## 2018-02-15 NOTE — Progress Notes (Signed)
Family states understanding of instructions

## 2018-02-15 NOTE — Transfer of Care (Signed)
Immediate Anesthesia Transfer of Care Note  Patient: Keyan B Odowd  Procedure(s) Performed: SHOULDER ARTHROSCOPY WITH POSTERIOR LABRAL REPAIR, EXTENSIVE DEBRIDEMENT, BICEPTS TENODESIS, DECOMPRESSION LABRAL CYST (Left Shoulder)  Patient Location: PACU  Anesthesia Type:General  Level of Consciousness: sedated  Airway & Oxygen Therapy: Patient Spontanous Breathing and Patient connected to face mask oxygen  Post-op Assessment: Report given to RN and Post -op Vital signs reviewed and stable  Post vital signs: Reviewed and stable  Last Vitals:  Vitals Value Taken Time  BP 157/97 02/15/2018 12:21 PM  Temp    Pulse 88 02/15/2018 12:25 PM  Resp 25 02/15/2018 12:25 PM  SpO2 97 % 02/15/2018 12:25 PM  Vitals shown include unvalidated device data.  Last Pain:  Vitals:   02/15/18 0730  TempSrc:   PainSc: 0-No pain         Complications: No apparent anesthesia complications

## 2018-02-15 NOTE — Op Note (Signed)
DATE: 02/15/2018   PRE-OP DIAGNOSIS:  1. Left shoulder posterior labral tear 2. Left shoulder SLAP tear 3. Left shoulder posterior paralabral cyst 4. Left shoulder osteoarthritis    POST-OP DIAGNOSIS:  1. Left shoulder posterior labral tear 2. Left shoulder SLAP tear 3. Left shoulder posterior paralabral cyst 4. Left shoulder osteoarthritis   PROCEDURES:  1. Left shoulder arthroscopic posterior labral repair and capsulorraphy 2. Left open subpectoral biceps tenodesis 3. Left shoulder extensive glenohumeral debridement 4. Left shoulder arthroscopic paralabral cyst decompression   SURGEON: Cato Mulligan, MD   ANESTHESIA: Regional + Gen   TOTAL IV FLUIDS: per anesthesia record   ESTIMATED BLOOD LOSS: 25cc   DRAINS: None    SPECIMENS: None.    IMPLANTS:  Arthrex 2.51mm BioComposite PushLock Suture Anchors x 2 Arthrex Double loaded Software engineer - x1   COMPLICATIONS: None    Indications:  Nicholas Cook is a 27 y.o. male with a history of shoulder pain for over 1 year. Clinical examination and imaging suggested posterior labral tear, SLAP tear, posterior paralabral cyst, and sings of early osteoarthritis. The patient has failed non-operative management in the form of appropriate physical therapy, medications, activity modifications, and corticosteroid injection. After discussion of risks, benefits, and alternatives to surgery, the patient elected to proceed. We did have a lengthy discussion preoperatively regarding his expected outcome. Given that he had some imaging findings suggestive of osteoarthritis, he may not get complete or long-lasting pain relief even after the procedure. We also discussed the importance of post-operative physical therapy to prevent stiffness. Patient understands the above and wishes to proceed.    Procedure Details  The patient was seen in the Holding Room. The risks, benefits, complications, treatment options, and expected outcomes were  discussed with the patient. The risks and potential complications of the problem and proposed treatment were discussed. This includes but is not limited to failure to fully relieve pain, continued or recurrence of pain,, infection, neurovascular compromise, complications from anesthesia, stiff shoulder, bleeding, DVT, and reoperation. The patient concurred with the proposed plan, giving informed consent. The site of surgery was properly noted/marked.   The patient was placed on the OR bed in the beach chair position using a beanbag. All bony prominences were well padded. The patient was given appropriate preoperative IV antibiotics. The upper extremity was prescrubbed with alcohol, prepped with Chloroprep, and draped in the usual sterile fashion. A Time Out Procedure was performed confirming correct patient, procedure, and laterality.   Exam under anesthesia showed: laxity anterior 1+, posterior 1+. 160FF, 45 ER with arm at side. With arm abducted/ER: 90 ER, 50 IR.   Glenohumeral portals were marked and injected with dilute epinephrine in lidocaine. An 11 blade was used to make stab incisions in the posterior soft spot for the standard posterior viewing portal. I made an additional high anterior portal, which was used as our anterior viewing portal. We performed a thorough arthroscopic evaluation of the glenohumeral joint through both the anterior and posterior viewing portals.    Glenohumeral Joint: Articular cartilage of the humeral head and glenoid: Significant diffuse grade 3 degenerative changes to the humeral head. Diffuse Grade 2-3 degenerative changes to the glenoid. Focal area of Grade 4 degenerative changes to the posterior/superior glenoid.  Synovium: severely injected and erythematous Anterior Labrum: mild degenerative appearance with slight elevation off of the glenoid face and a radial appearing tear in the 5-6 o'clock region. There was also a large sublabral foramen Posterior Labrum: mild  longitudinal tearing  within the posterior labrum with posterior labral tear off glenoid extending from 11 o'clock to 7 o'clock position Superior Labrum: Type 2 SLAP tear Inferior Labrum: no labrum attached inferiorly at 6 o'clock due to radial tear from anterior labrum Anterior Capsule: synovitic Inferior Capsule: normal  Posterior Capsule: severe synovitis Rotator interval: normal  Superior glenohumeral ligament: normal  Middle glenohumeral ligament: normal  Inferior glenohumeral ligament: normal. No HAGL. Biceps: significant erythema near insertion onto superior labrum Rotator cuff findings: normal subscapularis, supraspinatus, infraspinatus.   The biceps tendon was cut using arthroscopic scissors. The stump of the proximal biceps was debrided with a shaver. The frayed edges of the superior labrum and posterior labrum were debrided with a shaver. The injected synovium anteriorly and posteriorly was debrided with a combination of shaver and arthrocare wand. An accessory inferior anterior portal was made as a working portal. The elbow was placed in a position of slight anterior and inferior traction to allow for improved visualization. Arthroscope was placed in the high anterior viewing portal. Next, a portal of [place] was made along the posterior 1/3 of the acromion just inferior to the lateral edge using a spinal needle to confirm appropriate positioning.  A 60mm cannula was placed in the portal of [place] with care taken to avoid the distal 2cm of the rotator cuff tendon insertion. An elevator was used to elevate the labrum off the glenoid posteriorly in the affected regions described above. A rasp and shaver were used to roughen the glenoid for improvement of healing. The paralabral cyst was debrided by placing a probe posterior to the glenoid until cystic fluid was returned. I decided to place the lowest anchor at the 9 o'clock position. I did not go lower as he did not have posterior  instability and did not want to risk over-constraining his shoulder, given the significant existing osteoarthritis. We began by passing tape through a very minimal amount of posterior capsule and the entirety of the labrum at the 9:00 position using an Arthrex SutureLasso 25 degree to the left. A 2.9 mm PushLock was loaded onto to the suture. Drill guide was placed more on the posterior face of the glenoid to help reduce the amount of exposed bone with the posterior chondral defect. The anchor was advanced to the glenoid, the sutures tightened, and the anchor impacted with good purchase. This nicely tightened the labrum onto the glenoid as a bumper and tightened the capsule. The suture tails were cut flush with the articular cartilage. This process was repeated for an anchor 10:30 position. The repair was stable to probing. A majority of the posterior/superior chondral defect was covered by the repaired labrum. The shaver was used to debride any loose bony fragments from drilling as well as the previously visualized frayed cartilage edges on the humerus and glenoid. The arthroscope was switched back to the posterior viewing portal and the radial component of the anterior/inferior labral tear was debrided with a shaver. Anterior labrum was not repaired since he did not have anterior instability and I did not want to overconstrain his shoulder.   We then turned our attention to performing the subpectoral biceps tenodesis. I made an ~5cm incision parallel to the skin relaxation lines along the medial upper arm, such that 1/3 of the incision was above the pectoralis major tendon and 2/3 below it. Sharp dissection was carried down to the fascia overlying the pectoralis and the short head of the biceps. Bovie electrocautery was used to achieve hemostasis. I made a vertical  nick in the fascia in line with the short head of the biceps. The pectoralis tendon was then retracted with a large Hohmann retractor. An [employer]  retractor was used to retract inferiorly. This afforded excellent visualization of the long head of the biceps tendon. It was delivered out of the incision. I then placed a double-loaded Arthrex FiberTak all-suture anchor at the most proximal and central portion of the inter-tubercular groove that I could visualize. I took one set of tapes and with the use of a free needle, passed one strand through the biceps tendon once and passed the other strand through the biceps in a locking fashion. Suture tapes were passed through the biceps tendon at the level of the musculocutaneous junction. This was also done for the other tape as well. This configuration allowed me to then parachute the tendon back into the wound as the two ends of the suture tape were tied together with a Surgeon's knot. This was repeated for the other strand of suture tape. The excess proximal tendon was sharply excised. The tension in the tenodesed long head was excellent. The wound was thoroughly irrigated with saline, and the incision closed in layers using 3-0 Vicryl for the deep dermis and a running subcuticular 4-0 Monocryl for skin. A thin layer of Dermabond was applied.    That concluded the case. The portal sites were closed with 3-0 nylon. Xeroform gauze and sterile dressings were applied. Patient was placed in a neutral shoulder immobilizer and Polar Care.      POSTOPERATIVE PLAN:  Patient will be discharged to home.  Physical therapy 3-4 days after surgery. Patient is at high risk of postoperative stiffness and must achieve appropriate passive range of motion. Return to the clinic 10-14 days postop for suture removal Maintain arm in immobilizer with neutral wedge. NWB.

## 2018-02-15 NOTE — Anesthesia Post-op Follow-up Note (Signed)
Anesthesia QCDR form completed.        

## 2018-02-15 NOTE — Anesthesia Procedure Notes (Signed)
Anesthesia Regional Block: Interscalene brachial plexus block   Pre-Anesthetic Checklist: ,, timeout performed, Correct Patient, Correct Site, Correct Laterality, Correct Procedure, Correct Position, site marked, Risks and benefits discussed,  Surgical consent,  Pre-op evaluation,  At surgeon's request and post-op pain management  Laterality: Left  Prep: chloraprep       Needles:  Injection technique: Single-shot  Needle Type: Stimiplex     Needle Length: 10cm  Needle Gauge: 21     Additional Needles:   Procedures:,,,, ultrasound used (permanent image in chart),,,,  Narrative:  Start time: 02/15/2018 7:23 AM End time: 02/15/2018 7:29 AM Injection made incrementally with aspirations every 5 mL.  Performed by: Personally  Anesthesiologist: Emmie Niemann, MD  Additional Notes: Functioning IV was confirmed and monitors were applied.  A Stimuplex needle was used. Sterile prep and drape,hand hygiene and sterile gloves were used.  Negative aspiration and negative test dose prior to incremental administration of local anesthetic. The patient tolerated the procedure well.

## 2018-02-15 NOTE — Progress Notes (Signed)
Pt nauseated after moving from PACU   NO [place]

## 2018-07-26 ENCOUNTER — Other Ambulatory Visit: Payer: Self-pay | Admitting: Family Medicine

## 2018-07-26 ENCOUNTER — Ambulatory Visit
Admission: RE | Admit: 2018-07-26 | Discharge: 2018-07-26 | Disposition: A | Discharge status: Home / Self Care | Payer: Managed Care, Other (non HMO) | Source: Ambulatory Visit | Attending: Family Medicine | Admitting: Family Medicine

## 2018-07-26 DIAGNOSIS — H539 Unspecified visual disturbance: Secondary | ICD-10-CM

## 2021-06-15 ENCOUNTER — Ambulatory Visit: Payer: Managed Care, Other (non HMO) | Admitting: Dermatology

## 2021-06-15 ENCOUNTER — Other Ambulatory Visit: Payer: Self-pay

## 2021-06-15 DIAGNOSIS — D239 Other benign neoplasm of skin, unspecified: Secondary | ICD-10-CM

## 2021-06-15 DIAGNOSIS — L700 Acne vulgaris: Secondary | ICD-10-CM | POA: Diagnosis not present

## 2021-06-15 DIAGNOSIS — L905 Scar conditions and fibrosis of skin: Secondary | ICD-10-CM

## 2021-06-15 DIAGNOSIS — L814 Other melanin hyperpigmentation: Secondary | ICD-10-CM

## 2021-06-15 DIAGNOSIS — Z1283 Encounter for screening for malignant neoplasm of skin: Secondary | ICD-10-CM

## 2021-06-15 DIAGNOSIS — D225 Melanocytic nevi of trunk: Secondary | ICD-10-CM

## 2021-06-15 DIAGNOSIS — D235 Other benign neoplasm of skin of trunk: Secondary | ICD-10-CM

## 2021-06-15 DIAGNOSIS — L821 Other seborrheic keratosis: Secondary | ICD-10-CM

## 2021-06-15 DIAGNOSIS — D1800 Hemangioma unspecified site: Secondary | ICD-10-CM

## 2021-06-15 DIAGNOSIS — L578 Other skin changes due to chronic exposure to nonionizing radiation: Secondary | ICD-10-CM

## 2021-06-15 DIAGNOSIS — D485 Neoplasm of uncertain behavior of skin: Secondary | ICD-10-CM

## 2021-06-15 DIAGNOSIS — D229 Melanocytic nevi, unspecified: Secondary | ICD-10-CM

## 2021-06-15 HISTORY — DX: Other benign neoplasm of skin, unspecified: D23.9

## 2021-06-15 NOTE — Patient Instructions (Addendum)
Wound Care Instructions  Cleanse wound gently with soap and water once a day then pat dry with clean gauze. Apply a thing coat of Petrolatum (petroleum jelly, "Vaseline") over the wound (unless you have an allergy to this). We recommend that you use a new, sterile tube of Vaseline. Do not pick or remove scabs. Do not remove the yellow or white "healing tissue" from the base of the wound.  Cover the wound with fresh, clean, nonstick gauze and secure with paper tape. You may use Band-Aids in place of gauze and tape if the would is small enough, but would recommend trimming much of the tape off as there is often too much. Sometimes Band-Aids can irritate the skin.  You should call the office for your biopsy report after 1 week if you have not already been contacted.  If you experience any problems, such as abnormal amounts of bleeding, swelling, significant bruising, significant pain, or evidence of infection, please call the office immediately.  FOR ADULT SURGERY PATIENTS: If you need something for pain relief you may take 1 extra strength Tylenol (acetaminophen) AND 2 Ibuprofen (200mg each) together every 4 hours as needed for pain. (do not take these if you are allergic to them or if you have a reason you should not take them.) Typically, you may only need pain medication for 1 to 3 days.   If you have any questions or concerns for your doctor, please call our main line at [phone] and press option 4 to reach your doctor's medical assistant. If no one answers, please leave a voicemail as directed and we will return your call as soon as possible. Messages left after 4 pm will be answered the following business day.   You may also send us a message via MyChart. We typically respond to MyChart messages within 1-2 business days.  For prescription refills, please ask your pharmacy to contact our office. Our fax number is [phone].  If you have an urgent issue when the clinic is closed that  cannot wait until the next business day, you can page your doctor at the number below.    Please note that while we do our best to be available for urgent issues outside of office hours, we are not available 24/7.   If you have an urgent issue and are unable to reach us, you may choose to seek medical care at your doctor's office, retail clinic, urgent care center, or emergency room.  If you have a medical emergency, please immediately call 911 or go to the emergency department.  Pager Numbers  - Dr. Kowalski: [phone]  - Dr. Moye: [phone]  - Dr. Stewart: [phone]  In the event of inclement weather, please call our main line at [phone] for an update on the status of any delays or closures.  Dermatology Medication Tips: Please keep the boxes that topical medications come in in order to help keep track of the instructions about where and how to use these. Pharmacies typically print the medication instructions only on the boxes and not directly on the medication tubes.   If your medication is too expensive, please contact our office at [phone] option 4 or send us a message through MyChart.   We are unable to tell what your co-pay for medications will be in advance as this is different depending on your insurance coverage. However, we may be able to find a substitute medication at lower cost or fill out paperwork to get insurance to cover a needed   medication.   If a prior authorization is required to get your medication covered by your insurance company, please allow us 1-2 business days to complete this process.  Drug prices often vary depending on where the prescription is filled and some pharmacies may offer cheaper prices.  The website www.goodrx.com contains coupons for medications through different pharmacies. The prices here do not account for what the cost may be with help from insurance (it may be cheaper with your insurance), but the website can give you the  price if you did not use any insurance.  - You can print the associated coupon and take it with your prescription to the pharmacy.  - You may also stop by our office during regular business hours and pick up a GoodRx coupon card.  - If you need your prescription sent electronically to a different pharmacy, notify our office through [facility] MyChart or by phone at [phone] option 4.   

## 2021-06-15 NOTE — Progress Notes (Signed)
New Patient Visit  Subjective  Nicholas Cook is a 30 y.o. male who presents for the following: irregular skin lesions (On the back - patient has had a lot of sun exposure and he would like his back checked today. ). The patient presents for Upper Body Skin Exam (UBSE) for skin cancer screening and mole check.  The following portions of the chart were reviewed this encounter and updated as appropriate:   Tobacco  Allergies  Meds  Problems  Med Hx  Surg Hx  Fam Hx     Review of Systems:  No other skin or systemic complaints except as noted in HPI or Assessment and Plan.  Objective  Well appearing patient in no apparent distress; mood and affect are within normal limits.  All skin waist up examined.  R scapula Firm pink/brown papulenodule with dimple sign.   Face and back Active papules of the face and back.   Upper back spinal 0.4 cm irregular brown macule.   R upper back paraspinal 0.6 cm irregular brown macule.   Assessment & Plan  Dermatofibroma R scapula Benign growth possibly related to trauma, such as an insect bite.  Discussed removal (shave vrs excision), resulting scar and risk of recurrence. Since not bothersome, will observe for now.  Acne vulgaris Face and back Chronic and persistent  recommend CLN wash daily in the shower.  Consider prescription treatment in the future.  Neoplasm of uncertain behavior of skin (2) Upper back spinal Epidermal / dermal shaving Lesion diameter (cm):  0.4 Informed consent: discussed and consent obtained   Timeout: patient name, date of birth, surgical site, and procedure verified   Procedure prep:  Patient was prepped and draped in usual sterile fashion Prep type:  Isopropyl alcohol Anesthesia: the lesion was anesthetized in a standard fashion   Anesthetic:  1% lidocaine w/ epinephrine 1-100,000 buffered w/ 8.4% NaHCO3 Instrument used: flexible razor blade   Hemostasis achieved with: pressure, aluminum  chloride and electrodesiccation   Outcome: patient tolerated procedure well   Post-procedure details: sterile dressing applied and wound care instructions given   Dressing type: bandage and petrolatum    Specimen 1 - Surgical pathology Differential Diagnosis: D48.5 r/o dysplastic nevus  Check Margins: No  R upper back paraspinal Epidermal / dermal shaving Lesion diameter (cm):  0.6 Informed consent: discussed and consent obtained   Timeout: patient name, date of birth, surgical site, and procedure verified   Procedure prep:  Patient was prepped and draped in usual sterile fashion Prep type:  Isopropyl alcohol Anesthesia: the lesion was anesthetized in a standard fashion   Anesthetic:  1% lidocaine w/ epinephrine 1-100,000 buffered w/ 8.4% NaHCO3 Instrument used: flexible razor blade   Hemostasis achieved with: pressure, aluminum chloride and electrodesiccation   Outcome: patient tolerated procedure well   Post-procedure details: sterile dressing applied and wound care instructions given   Dressing type: bandage and petrolatum    Specimen 2 - Surgical pathology Differential Diagnosis: D48.5 r/o dysplastic nevus Check Margins: No  Lentigines - Scattered tan macules - Due to sun exposure - Benign-appearing, observe - Recommend daily broad spectrum sunscreen SPF 30+ to sun-exposed areas, reapply every 2 hours as needed. - Call for any changes  Seborrheic Keratoses - Stuck-on, waxy, tan-brown papules and/or plaques  - Benign-appearing - Discussed benign etiology and prognosis. - Observe - Call for any changes  Melanocytic Nevi - Tan-brown and/or pink-flesh-colored symmetric macules and papules - Benign appearing on exam today - Observation - Call clinic  for new or changing moles - Recommend daily use of broad spectrum spf 30+ sunscreen to sun-exposed areas.   Hemangiomas - Red papules - Discussed benign nature - Observe - Call for any changes  Actinic Damage -  Chronic condition, secondary to cumulative UV/sun exposure - diffuse scaly erythematous macules with underlying dyspigmentation - Recommend daily broad spectrum sunscreen SPF 30+ to sun-exposed areas, reapply every 2 hours as needed.  - Staying in the shade or wearing long sleeves, sun glasses (UVA+UVB protection) and wide brim hats (4-inch brim around the entire circumference of the hat) are also recommended for sun protection.  - Call for new or changing lesions.  Skin cancer screening performed today.  Return in about 2 years (around 06/16/2023) for TBSE.  Luther Redo, CMA, am acting as scribe for Sarina Ser, MD . Documentation: I have reviewed the above documentation for accuracy and completeness, and I agree with the above.  Sarina Ser, MD

## 2021-06-20 ENCOUNTER — Encounter: Payer: Self-pay | Admitting: Dermatology

## 2021-06-22 ENCOUNTER — Telehealth: Payer: Self-pay

## 2021-06-22 NOTE — Telephone Encounter (Signed)
-----   Message from Ralene Bathe, MD sent at 06/16/2021  5:37 PM EST ----- Diagnosis 1. 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

## 2021-06-22 NOTE — Telephone Encounter (Signed)
Advised patient of results and scheduled surgery appointment/hd 

## 2021-06-28 ENCOUNTER — Ambulatory Visit (INDEPENDENT_AMBULATORY_CARE_PROVIDER_SITE_OTHER): Payer: Managed Care, Other (non HMO) | Admitting: Dermatology

## 2021-06-28 ENCOUNTER — Other Ambulatory Visit: Payer: Self-pay

## 2021-06-28 ENCOUNTER — Encounter: Payer: Self-pay | Admitting: Dermatology

## 2021-06-28 DIAGNOSIS — D239 Other benign neoplasm of skin, unspecified: Secondary | ICD-10-CM

## 2021-06-28 DIAGNOSIS — D492 Neoplasm of unspecified behavior of bone, soft tissue, and skin: Secondary | ICD-10-CM

## 2021-06-28 DIAGNOSIS — D225 Melanocytic nevi of trunk: Secondary | ICD-10-CM | POA: Diagnosis not present

## 2021-06-28 DIAGNOSIS — D235 Other benign neoplasm of skin of trunk: Secondary | ICD-10-CM | POA: Diagnosis not present

## 2021-06-28 MED ORDER — MUPIROCIN 2 % EX OINT
1.0000 | TOPICAL_OINTMENT | Freq: Every day | CUTANEOUS | 1 refills | Status: AC
Start: 2021-06-28 — End: ?

## 2021-06-28 NOTE — Progress Notes (Signed)
Follow-Up Visit   Subjective  Nicholas Cook is a 30 y.o. male who presents for the following: Dysplastic Nevus bx proven (Upper back spinal, pt presents for excision today) and dysplastic nevus bx proven (R upper paraspinal, Severe atypia, recheck).  The following portions of the chart were reviewed this encounter and updated as appropriate:   Tobacco  Allergies  Meds  Problems  Med Hx  Surg Hx  Fam Hx     Review of Systems:  No other skin or systemic complaints except as noted in HPI or Assessment and Plan.  Objective  Well appearing patient in no apparent distress; mood and affect are within normal limits.  A focused examination was performed including back. Relevant physical exam findings are noted in the Assessment and Plan.  Upper back spinal Pink bx site 0.7cm  R upper paraspinal Pink bx site 1.2cm   Assessment & Plan  Neoplasm of skin Upper back spinal  Skin excision  Lesion length (cm):  0.7 Lesion width (cm):  0.7 Margin per side (cm):  0.2 Total excision diameter (cm):  1.1 Informed consent: discussed and consent obtained   Timeout: patient name, date of birth, surgical site, and procedure verified   Procedure prep:  Patient was prepped and draped in usual sterile fashion Prep type:  Isopropyl alcohol and povidone-iodine Anesthesia: the lesion was anesthetized in a standard fashion   Anesthetic:  1% lidocaine w/ epinephrine 1-100,000 buffered w/ 8.4% NaHCO3 (6cc lido w/ epi, 3cc bupivicaine, Total 9cc) Instrument used: #15 blade   Hemostasis achieved with: pressure   Hemostasis achieved with comment:  Electrocautery Outcome: patient tolerated procedure well with no complications   Post-procedure details: sterile dressing applied and wound care instructions given   Dressing type: bandage and pressure dressing (Mupirocin)    Skin repair Complexity:  Complex Final length (cm):  3.5 Reason for type of repair: reduce tension to allow closure,  reduce the risk of dehiscence, infection, and necrosis, reduce subcutaneous dead space and avoid a hematoma, allow closure of the large defect, preserve normal anatomy, preserve normal anatomical and functional relationships and enhance both functionality and cosmetic results   Undermining: area extensively undermined   Undermining comment:  Undermining Defect 1.1cm Subcutaneous layers (deep stitches):  Suture size:  2-0 Suture type: Vicryl (polyglactin 910)   Subcutaneous suture technique: Inverted Dermal. Fine/surface layer approximation (top stitches):  Suture size:  3-0 Suture type: Vicryl (polyglactin 910)   Stitches: simple running   Suture removal (days):  7 Hemostasis achieved with: pressure Outcome: patient tolerated procedure well with no complications   Post-procedure details: sterile dressing applied and wound care instructions given   Dressing type: bandage, pressure dressing and bacitracin (Mupirocin)    mupirocin ointment (BACTROBAN) 2 % Apply 1 application topically daily. Qd to excision site  Specimen 1 - Surgical pathology Differential Diagnosis: D48.5 Bx proven Moderated to severe dysplastic nevus  Check Margins: yes Pink bx site [id number]   Moderate to severe atypia, bx proven  Start Mupirocin oint qd to excision site  Dysplastic nevus R upper paraspinal  Severe atypia, bx proven  Shave removal today  Epidermal / dermal shaving - R upper paraspinal  Lesion diameter (cm):  1.2 Informed consent: discussed and consent obtained   Timeout: patient name, date of birth, surgical site, and procedure verified   Procedure prep:  Patient was prepped and draped in usual sterile fashion Prep type:  Isopropyl alcohol Anesthesia: the lesion was anesthetized in a standard fashion  Anesthetic:  1% lidocaine w/ epinephrine 1-100,000 buffered w/ 8.4% NaHCO3 Instrument used: flexible razor blade   Hemostasis achieved with: pressure, aluminum chloride and  electrodesiccation   Outcome: patient tolerated procedure well   Post-procedure details: sterile dressing applied and wound care instructions given   Dressing type: bandage and petrolatum    Specimen 2 - Surgical pathology Differential Diagnosis: D48.5 Bx proven Severe dysplastic nevus  Check Margins: yes Pink bx site 1.2  Return in about 1 week (around 07/05/2021) for suture removal.  I, Othelia Pulling, RMA, am acting as scribe for Sarina Ser, MD . Documentation: I have reviewed the above documentation for accuracy and completeness, and I agree with the above.  Sarina Ser, MD

## 2021-06-28 NOTE — Patient Instructions (Signed)

## 2021-06-29 ENCOUNTER — Telehealth: Payer: Self-pay

## 2021-06-29 NOTE — Telephone Encounter (Signed)
Pt doing fine after yesterdays surgery./sh 

## 2021-07-05 ENCOUNTER — Encounter: Payer: Self-pay | Admitting: Dermatology

## 2021-07-05 ENCOUNTER — Other Ambulatory Visit: Payer: Self-pay

## 2021-07-05 ENCOUNTER — Ambulatory Visit (INDEPENDENT_AMBULATORY_CARE_PROVIDER_SITE_OTHER): Payer: Managed Care, Other (non HMO) | Admitting: Dermatology

## 2021-07-05 DIAGNOSIS — Z4802 Encounter for removal of sutures: Secondary | ICD-10-CM

## 2021-07-05 DIAGNOSIS — Z86018 Personal history of other benign neoplasm: Secondary | ICD-10-CM

## 2021-07-05 NOTE — Patient Instructions (Addendum)
If You Need Anything After Your Visit  If you have any questions or concerns for your doctor, please call our main line at [phone] and press option 4 to reach your doctor's medical assistant. If no one answers, please leave a voicemail as directed and we will return your call as soon as possible. Messages left after 4 pm will be answered the following business day.   You may also send us a message via MyChart. We typically respond to MyChart messages within 1-2 business days.  For prescription refills, please ask your pharmacy to contact our office. Our fax number is [phone].  If you have an urgent issue when the clinic is closed that cannot wait until the next business day, you can page your doctor at the number below.    Please note that while we do our best to be available for urgent issues outside of office hours, we are not available 24/7.   If you have an urgent issue and are unable to reach us, you may choose to seek medical care at your doctor's office, retail clinic, urgent care center, or emergency room.  If you have a medical emergency, please immediately call 911 or go to the emergency department.  Pager Numbers  - Dr. Kowalski: [phone]  - Dr. Moye: [phone]  - Dr. Stewart: [phone]  In the event of inclement weather, please call our main line at [phone] for an update on the status of any delays or closures.  Dermatology Medication Tips: Please keep the boxes that topical medications come in in order to help keep track of the instructions about where and how to use these. Pharmacies typically print the medication instructions only on the boxes and not directly on the medication tubes.   If your medication is too expensive, please contact our office at [phone] option 4 or send us a message through MyChart.   We are unable to tell what your co-pay for medications will be in advance as this is different depending on your insurance coverage.  However, we may be able to find a substitute medication at lower cost or fill out paperwork to get insurance to cover a needed medication.   If a prior authorization is required to get your medication covered by your insurance company, please allow us 1-2 business days to complete this process.  Drug prices often vary depending on where the prescription is filled and some pharmacies may offer cheaper prices.  The website www.goodrx.com contains coupons for medications through different pharmacies. The prices here do not account for what the cost may be with help from insurance (it may be cheaper with your insurance), but the website can give you the price if you did not use any insurance.  - You can print the associated coupon and take it with your prescription to the pharmacy.  - You may also stop by our office during regular business hours and pick up a GoodRx coupon card.  - If you need your prescription sent electronically to a different pharmacy, notify our office through [place] MyChart or by phone at [phone] option 4.     Si Usted Necesita Algo Despus de Su Visita  Tambin puede enviarnos un mensaje a travs de MyChart. Por lo general respondemos a los mensajes de MyChart en el transcurso de 1 a 2 das hbiles.  Para renovar recetas, por favor pida a su farmacia que se ponga en contacto con nuestra oficina. Nuestro nmero de fax es el [phone].  Si tiene   un asunto urgente cuando la clnica est cerrada y que no puede esperar hasta el siguiente da hbil, puede llamar/localizar a su doctor(a) al nmero que aparece a continuacin.   Por favor, tenga en cuenta que aunque hacemos todo lo posible para estar disponibles para asuntos urgentes fuera del horario de [place], no estamos disponibles las 24 horas del da, los 7 das de la [place].   Si tiene un problema urgente y no puede comunicarse con nosotros, puede optar por buscar atencin mdica  en el consultorio de su  doctor(a), en una clnica privada, en un centro de atencin urgente o en una sala de emergencias.  Si tiene Engineering geologist, por favor llame inmediatamente al 911 o vaya a la sala de emergencias.  Nmeros de bper  - Dr. Nehemiah Massed: [phone]  - Dra. Moye: [phone]  - Dra. Nicole Kindred: [phone]  En caso de inclemencias del [place], por favor llame a Johnsie Kindred principal al [phone] para una actualizacin sobre el [place] de cualquier retraso o cierre.  Consejos para la medicacin en dermatologa: Por favor, guarde las cajas en las que vienen los medicamentos de uso tpico para ayudarle a seguir las instrucciones sobre dnde y cmo usarlos. Las farmacias generalmente imprimen las instrucciones del medicamento slo en las cajas y no directamente en los tubos del [place].   Si su medicamento es muy caro, por favor, pngase en contacto con Zigmund Daniel llamando al [phone] y presione la opcin 4 o envenos un mensaje a travs de Pharmacist, community.   No podemos decirle cul ser su copago por los medicamentos por adelantado ya que esto es diferente dependiendo de la cobertura de su seguro. Sin embargo, es posible que podamos encontrar un medicamento sustituto a Electrical engineer un formulario para que el seguro cubra el medicamento que se considera necesario.   Si se requiere una autorizacin previa para que su compaa de seguros [place] su medicamento, por favor permtanos de 1 a 2 das hbiles para completar este proceso.  Los precios de los medicamentos varan con frecuencia dependiendo del Environmental consultant de dnde se surte la receta y alguna farmacias pueden ofrecer precios ms baratos.  El sitio web www.goodrx.com tiene cupones para medicamentos de Airline pilot. Los precios aqu no tienen en cuenta lo que podra costar con la ayuda del seguro (puede ser ms barato con su seguro), pero el sitio web puede darle el precio si no utiliz Research scientist (physical sciences).  - Puede imprimir el cupn  correspondiente y llevarlo con su receta a la farmacia.  - Tambin puede pasar por nuestra oficina durante el horario de atencin regular y Charity fundraiser una tarjeta de cupones de GoodRx.  - Si necesita que su receta se enve electrnicamente a una farmacia diferente, informe a nuestra oficina a travs de MyChart de [place] o por telfono llamando al [phone] y presione la opcin 4.    Mole A mole is a colored (pigmented) growth on the skin. Moles are very common. They are usually harmless, but some moles can become cancerous over time. What are the causes? Moles are caused when pigmented skin cells grow together in clusters instead of spreading out in the skin as they normally do. The reason why the skin cells grow together in clusters is not known. What increases the risk? You are more likely to develop a mole if you: Have family members who have moles. Are white. Have blond hair. Are often outdoors and exposed to the sun. Received phototherapy when you were a newborn  baby. Are male. What are the signs or symptoms? A mole may be: [place] or black. Flat or raised. Smooth or wrinkled. How is this diagnosed? A mole is diagnosed with a skin exam. If your health care provider thinks a mole may be cancerous, all or part of the mole will be removed for testing (biopsy). How is this treated? Most moles are noncancerous (benign) and do not require treatment. If a mole is found to be cancerous, it will be removed. You may also choose to have a mole removed if it is causing pain or if you do not like the way it looks. Follow these instructions at home: General instructions  Every month, look for new moles and check your existing moles for changes. This is important because a change in a mole can mean that the mole has become cancerous. ABCDE changes in a mole indicate that you should be evaluated by your health care provider. ABCDE stands for: Asymmetry. This means the mole has an irregular  shape. It is not round or oval. Border. This means the mole has an irregular or bumpy border. Color. This means the mole has multiple colors in it, including brown, black, blue, red, or tan. Note that it is normal for moles to get darker when a woman is pregnant or takes birth control pills. Diameter. This means the mole is more than 0.2 inches (6 mm) across. Evolving. This refers to any unusual changes or symptoms in the mole, such as pain, itching, stinging, sensitivity, or bleeding. If you have a large number of moles, see a skin doctor (dermatologist) at least one time every year for a full-body skin check. Lifestyle  When you are outdoors, wear sunscreen with SPF 30 (sun protection factor 30) or higher. Use an adequate amount of sunscreen to cover exposed areas of skin. Put it on 30 minutes before you go out. Reapply it every 2 hours or anytime you come out of the water. When you are out in the sun, wear a broad-brimmed hat and clothing that covers your arms and legs. Wear wraparound sunglasses. Contact a health care provider if: The size, shape, borders, or color of your mole changes. Your mole, or the skin near the mole, becomes painful, sore, red, or swollen. Your mole: Develops more than one color. Itches or bleeds. Becomes scaly, sheds skin, or oozes fluid. Becomes flat or develops raised areas. Becomes hard or soft. You develop a new mole. Summary A mole is a colored (pigmented) growth on the skin. Moles are very common. They are usually harmless, but some moles can become cancerous over time. Every month, look for new moles and check your existing moles for changes. This is important because a change in a mole can mean that the mole has become cancerous. If you have a large number of moles, see a skin doctor (dermatologist) at least one time every year for a full-body skin check. When you are outdoors, wear sunscreen with SPF 30 (sun protection factor 30) or higher. Reapply it  every 2 hours or anytime you come out of the water. Contact a health care provider if you notice changes in a mole or if you develop a new mole. This information is not intended to replace advice given to you by your health care provider. Make sure you discuss any questions you have with your health care provider. Document Revised: 02/20/2019 Document Reviewed: 12/11/2017 Elsevier Patient Education  [place].

## 2021-07-05 NOTE — Progress Notes (Signed)
   Follow-Up Visit   Subjective  Nicholas Cook is a 30 y.o. male who presents for the following: suture removal  (For biopsy proven dysplastic nevus on the upper back spinal - patient is here today for suture removal.).  The following portions of the chart were reviewed this encounter and updated as appropriate:   Tobacco  Allergies  Meds  Problems  Med Hx  Surg Hx  Fam Hx     Review of Systems:  No other skin or systemic complaints except as noted in HPI or Assessment and Plan.  Objective  Well appearing patient in no apparent distress; mood and affect are within normal limits.  A focused examination was performed including the trunk, face, and extremities. Relevant physical exam findings are noted in the Assessment and Plan.  Upper back spinal Healing excision site.    Assessment & Plan  History of dysplastic nevus Upper back spinal  Encounter for Removal of Sutures - Incision site at the upper back spinal is clean, dry and intact - Wound cleansed, sutures removed, wound cleansed and steri strips applied.  - Discussed pathology results showing margins free severely dysplastic nevus.  - Patient advised to keep steri-strips dry until they fall off. - Scars remodel for a full year. - Once steri-strips fall off, patient can apply over-the-counter silicone scar cream each night to help with scar remodeling if desired. - Patient advised to call with any concerns or if they notice any new or changing lesions.   Return in about 1 year (around 07/05/2022) for TBSE.  Luther Redo, CMA, am acting as scribe for Sarina Ser, MD . Documentation: I have reviewed the above documentation for accuracy and completeness, and I agree with the above.  Sarina Ser, MD

## 2021-07-15 ENCOUNTER — Encounter: Payer: Self-pay | Admitting: Dermatology

## 2021-10-05 ENCOUNTER — Other Ambulatory Visit: Payer: Self-pay

## 2021-10-05 ENCOUNTER — Ambulatory Visit: Payer: Managed Care, Other (non HMO) | Admitting: Dermatology

## 2021-10-05 DIAGNOSIS — L910 Hypertrophic scar: Secondary | ICD-10-CM

## 2021-10-05 DIAGNOSIS — L814 Other melanin hyperpigmentation: Secondary | ICD-10-CM

## 2021-10-05 DIAGNOSIS — Z86018 Personal history of other benign neoplasm: Secondary | ICD-10-CM

## 2021-10-05 NOTE — Patient Instructions (Addendum)

## 2021-10-05 NOTE — Progress Notes (Signed)
? ?  Follow-Up Visit ?  ?Subjective  ?Nicholas Cook is a 31 y.o. male who presents for the following: Scar (Itchy, sites of previous dysplastic nevi, margins free. Shave removal and excision 06/28/2021). ? ? ?The following portions of the chart were reviewed this encounter and updated as appropriate:  ?  ?  ? ?Review of Systems:  No other skin or systemic complaints except as noted in HPI or Assessment and Plan. ? ?Objective  ?Well appearing patient in no apparent distress; mood and affect are within normal limits. ? ?A focused examination was performed including back. Relevant physical exam findings are noted in the Assessment and Plan. ? ?Right upper paraspinal, Upper Back Spinal ?Pink firm nodules at sites of previous dysplastic nevi ? ? ? ? ? ? ?Assessment & Plan  ?Hypertrophic scar (2) ?Right upper paraspinal; Upper Back Spinal ? ?If still bothersome may need additional treatment. Patient will call back. ? ?Intralesional injection - Right upper paraspinal, Upper Back Spinal ?Location: Spinal upper back, R upper paraspinal  ? ?Informed Consent: Discussed risks (infection, pain, bleeding, bruising, thinning of the skin, loss of skin pigment, lack of resolution, and recurrence of lesion) and benefits of the procedure, as well as the alternatives. Informed consent was obtained. ?Preparation: The area was prepared a standard fashion. ? ?Procedure Details: An intralesional injection was performed with Kenalog 20 mg/cc. 0.7 cc in total were injected. ? ?Total number of injections: >7 ? ?Plan: The patient was instructed on post-op care. Recommend OTC analgesia as needed for pain. ? ?Lot No [id number] Exp 09/2021 ?[place] [phone] ? ? ? ?Lentigines ?- Scattered tan macules ?- Due to sun exposure ?- Benign-appering, observe ?- Recommend daily broad spectrum sunscreen SPF 30+ to sun-exposed areas, reapply every 2 hours as needed. ?- Call for any changes  ? ?History of Dysplastic Nevi ?- No evidence of recurrence today ?-  Recommend regular full body skin exams ?- Recommend daily broad spectrum sunscreen SPF 30+ to sun-exposed areas, reapply every 2 hours as needed.  ?- Call if any new or changing lesions are noted between office visits  ? ?Return as scheduled with Dr Raliegh Ip. ? ?I, Jamesetta Orleans, CMA, am acting as scribe for Brendolyn Patty, MD . ? ?Documentation: I have reviewed the above documentation for accuracy and completeness, and I agree with the above. ? ?Brendolyn Patty MD  ? ? ?

## 2022-07-06 ENCOUNTER — Ambulatory Visit: Payer: Managed Care, Other (non HMO) | Admitting: Dermatology

## 2022-12-08 ENCOUNTER — Emergency Department: Payer: Managed Care, Other (non HMO)

## 2022-12-08 ENCOUNTER — Emergency Department
Admission: EM | Admit: 2022-12-08 | Discharge: 2022-12-08 | Disposition: A | Discharge status: Home / Self Care | Payer: Managed Care, Other (non HMO) | Attending: Emergency Medicine | Admitting: Emergency Medicine

## 2022-12-08 DIAGNOSIS — R251 Tremor, unspecified: Secondary | ICD-10-CM

## 2022-12-08 DIAGNOSIS — I160 Hypertensive urgency: Secondary | ICD-10-CM

## 2022-12-08 DIAGNOSIS — R202 Paresthesia of skin: Secondary | ICD-10-CM | POA: Insufficient documentation

## 2022-12-08 DIAGNOSIS — R42 Dizziness and giddiness: Secondary | ICD-10-CM | POA: Diagnosis present

## 2022-12-08 LAB — CBC
HCT: 48.7 % (ref 39.0–52.0)
Hemoglobin: 16 g/dL (ref 13.0–17.0)
MCH: 26.8 pg (ref 26.0–34.0)
MCHC: 32.9 g/dL (ref 30.0–36.0)
MCV: 81.7 fL (ref 80.0–100.0)
Platelets: 330 10*3/uL (ref 150–400)
RBC: 5.96 MIL/uL — ABNORMAL HIGH (ref 4.22–5.81)
RDW: 12.9 % (ref 11.5–15.5)
WBC: 7.2 10*3/uL (ref 4.0–10.5)
nRBC: 0 % (ref 0.0–0.2)

## 2022-12-08 LAB — HEPATIC FUNCTION PANEL
ALT: 29 U/L (ref 0–44)
AST: 31 U/L (ref 15–41)
Albumin: 4.5 g/dL (ref 3.5–5.0)
Alkaline Phosphatase: 72 U/L (ref 38–126)
Bilirubin, Direct: <0.1 mg/dL (ref 0.0–0.2)
Total Bilirubin: 0.8 mg/dL (ref 0.3–1.2)
Total Protein: 6.9 g/dL (ref 6.5–8.1)

## 2022-12-08 LAB — BASIC METABOLIC PANEL
Anion gap: 6 (ref 5–15)
BUN: 14 mg/dL (ref 6–20)
CO2: 24 mmol/L (ref 22–32)
Calcium: 8.7 mg/dL — ABNORMAL LOW (ref 8.9–10.3)
Chloride: 104 mmol/L (ref 98–111)
Creatinine, Ser: 0.96 mg/dL (ref 0.61–1.24)
GFR, Estimated: >60 mL/min (ref >60)
Glucose, Bld: 105 mg/dL — ABNORMAL HIGH (ref 70–99)
Potassium: 3.6 mmol/L (ref 3.5–5.1)
Sodium: 134 mmol/L — ABNORMAL LOW (ref 135–145)

## 2022-12-08 LAB — TROPONIN I (HIGH SENSITIVITY): Troponin I (High Sensitivity): 6 ng/L (ref <18)

## 2022-12-08 NOTE — Discharge Instructions (Addendum)
You were seen in the emergency department today for evaluation of your high blood pressure and shakiness.  Fortunately your lab work, EKG, and chest x-Keefer Soulliere were reassuring as well as your exam.  As we discussed, please keep a log of your blood pressure measurements at home and follow-up closely with your primary care doctor.  If you continue to have elevated pressures, they may wish to start you on a high blood pressure medication.  Return to the ER for new or worsening symptoms including chest pain, difficulty breathing, severe headache, or any other new or concerning symptoms.

## 2022-12-08 NOTE — ED Provider Triage Note (Signed)
Emergency Medicine Provider Triage Evaluation Note  Nicholas Cook , a 32 y.o. male  was evaluated in triage.  Pt complains of dizziness, lightheadedness, heart palpitations, tingling, no headache but did not act right per his wife.  Symptoms started about 630 this morning.  Patient got up took his Adderall, took a preworkout and symptoms started when he got back home from working out.  Denies chest pain at this time.  No slurred speech but states he could not get words out right did not feel right.  Patient is also taking Cialis and testosterone.  This may not be in his regular chart as he is followed by [facility] removing replacement facility.  Wife states that his blood pressure at home was 220/110, she gave him 50 mg of her metoprolol to try and decrease the blood pressure.  Review of Systems  Positive:  Negative:   Physical Exam  BP (!) 161/97 (BP Location: Right Arm)   Pulse 83   Temp 98.4 F (36.9 C) (Oral)   Resp 18   Ht 6\' 4"  (1.93 m)   Wt 120.2 kg   SpO2 99%   BMI 32.26 kg/m  Gen:   Awake, no distress   Resp:  Normal effort  MSK:   Moves extremities without difficulty  Other:    Medical Decision Making  Medically screening exam initiated at 1:38 PM.  Appropriate orders placed.  Nicholas Cook was informed that the remainder of the evaluation will be completed by another provider, this initial triage assessment does not replace that evaluation, and the importance of remaining in the ED until their evaluation is complete.  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

## 2022-12-08 NOTE — ED Provider Notes (Signed)
[facility] Provider Note    Event Date/Time   First MD Initiated Contact with Patient 12/08/22 1544     (approximate)   History   Dizziness   HPI  Nicholas Cook is a 32 y.o. male presenting to the emergency department for evaluation of dizziness and paresthesias.  Around 630 this morning, patient took his Adderall as well as preworkout.  He later developed a unusual sensation with tingling and shakiness.  No chest pain.  No headache.  He was checking his blood pressure at home and saw that it was increasing up to systolics greater than 200s.  He took one of his wife's metoprolol pills which she does feel improved his symptoms.  At time of my initial evaluation, patient feels completely back to normal.  He denies a known history of hypertension, but reports that he takes both testosterone and Adderall and knows that these can affect his blood pressure.     Physical Exam   Triage Vital Signs: ED Triage Vitals [12/08/22 1318]  Enc Vitals Group     BP (!) 161/97     Pulse Rate 83     Resp 18     Temp 98.4 F (36.9 C)     Temp Source Oral     SpO2 99 %     Weight 265 lb (120.2 kg)     Height 6\' 4"  (1.93 m)     Head Circumference      Peak Flow      Pain Score      Pain Loc      Pain Edu?      Excl. in GC?     Most recent vital signs: Vitals:   12/08/22 1318  BP: (!) 161/97  Pulse: 83  Resp: 18  Temp: 98.4 F (36.9 C)  SpO2: 99%     General: Awake, interactive  CV:  Regular rate, good peripheral perfusion.  Resp:  Lungs clear, unlabored respirations.  Abd:  Soft, nondistended.  Neuro:  Alert and oriented, normal extraocular movement, symmetric facial movement, sensation intact in bilateral upper and lower extremities with 5 out of 5 strength.  Normal finger-nose testing.  ED Results / Procedures / Treatments   Labs (all labs ordered are listed, but only abnormal results are displayed) Labs Reviewed  BASIC METABOLIC PANEL -  Abnormal; Notable for the following components:      Result Value   Sodium 134 (*)    Glucose, Bld 105 (*)    Calcium 8.7 (*)    All other components within normal limits  CBC - Abnormal; Notable for the following components:   RBC 5.96 (*)    All other components within normal limits  HEPATIC FUNCTION PANEL  TROPONIN I (HIGH SENSITIVITY)     EKG EKG independently reviewed interpreted by myself (ER attending) demonstrates:  EKG demonstrates normal sinus rhythm at a rate of 81, PR 164, QRS 100, QTc 383, no acute ST abnormality  RADIOLOGY Imaging independently reviewed and interpreted by myself demonstrates:  CXR without pneumonia, pneumothorax  PROCEDURES:  Critical Care performed: No  Procedures   MEDICATIONS ORDERED IN ED: Medications - No data to display   IMPRESSION / MDM / ASSESSMENT AND PLAN / ED COURSE  I reviewed the triage vital signs and the nursing notes.  Differential diagnosis includes, but is not limited to, hypertensive emergency including pulmonary edema, cardiac strain, low suspicion for intracranial process in the absence of headache or focal neurologic findings,  consideration for arrhythmia, stress mediated response, medication side effect  Patient's presentation is most consistent with acute presentation with potential threat to life or bodily function.  32 year old male presenting to the emergency department for evaluation of elevated blood pressure with associated shakiness and dizziness.  Symptoms resolved at the time of my initial evaluation.  Workup here is overall reassuring.  No evidence of hypertensive emergency.  No known history of hypertension, discussed keeping a blood pressure log and following up with his primary care doctor to see if he does need to be started on blood pressure medication.  He will also discuss his other medications with his care team to determine if these need to be adjusted.  He feels comfortable with discharge home.  Strict  return precautions provided. Patient discharged stable condition and     FINAL CLINICAL IMPRESSION(S) / ED DIAGNOSES   Final diagnoses:  Hypertensive urgency  Shakiness     Rx / DC Orders   ED Discharge Orders     None        Note:  This document was prepared using Dragon voice recognition software and may include unintentional dictation errors.   Trinna Post, MD 12/08/22 [phone]

## 2022-12-08 NOTE — Care Plan (Signed)
Pt refused head CT at 14:00

## 2022-12-08 NOTE — ED Triage Notes (Addendum)
Pt sts that he took his pre work out this morning with aderall as usual and than started to have severe shakes with a headache. Pt sts that his father had his first MI at his age. Pt sts that he has bent over and than started to have palpations. Pt sts that he has been cutting weight. Pt BP at home systolic was 220. Pt wife gave him one of her 50mg  of metoprolol this AM around 0700 also.

## 2023-11-14 ENCOUNTER — Encounter: Payer: Self-pay | Admitting: Dermatology

## 2023-11-14 ENCOUNTER — Ambulatory Visit: Payer: Managed Care, Other (non HMO) | Admitting: Dermatology

## 2023-11-14 DIAGNOSIS — W908XXA Exposure to other nonionizing radiation, initial encounter: Secondary | ICD-10-CM

## 2023-11-14 DIAGNOSIS — L700 Acne vulgaris: Secondary | ICD-10-CM

## 2023-11-14 DIAGNOSIS — I781 Nevus, non-neoplastic: Secondary | ICD-10-CM

## 2023-11-14 DIAGNOSIS — D489 Neoplasm of uncertain behavior, unspecified: Secondary | ICD-10-CM

## 2023-11-14 DIAGNOSIS — D2372 Other benign neoplasm of skin of left lower limb, including hip: Secondary | ICD-10-CM

## 2023-11-14 DIAGNOSIS — D492 Neoplasm of unspecified behavior of bone, soft tissue, and skin: Secondary | ICD-10-CM | POA: Diagnosis not present

## 2023-11-14 DIAGNOSIS — D239 Other benign neoplasm of skin, unspecified: Secondary | ICD-10-CM

## 2023-11-14 DIAGNOSIS — Z1283 Encounter for screening for malignant neoplasm of skin: Secondary | ICD-10-CM

## 2023-11-14 DIAGNOSIS — D229 Melanocytic nevi, unspecified: Secondary | ICD-10-CM

## 2023-11-14 DIAGNOSIS — L709 Acne, unspecified: Secondary | ICD-10-CM

## 2023-11-14 DIAGNOSIS — L910 Hypertrophic scar: Secondary | ICD-10-CM

## 2023-11-14 DIAGNOSIS — D1801 Hemangioma of skin and subcutaneous tissue: Secondary | ICD-10-CM

## 2023-11-14 DIAGNOSIS — L708 Other acne: Secondary | ICD-10-CM

## 2023-11-14 DIAGNOSIS — L578 Other skin changes due to chronic exposure to nonionizing radiation: Secondary | ICD-10-CM | POA: Diagnosis not present

## 2023-11-14 DIAGNOSIS — D235 Other benign neoplasm of skin of trunk: Secondary | ICD-10-CM

## 2023-11-14 DIAGNOSIS — L905 Scar conditions and fibrosis of skin: Secondary | ICD-10-CM

## 2023-11-14 DIAGNOSIS — L814 Other melanin hyperpigmentation: Secondary | ICD-10-CM

## 2023-11-14 NOTE — Patient Instructions (Addendum)
 Recommend daily broad spectrum sunscreen SPF 30+ to sun-exposed areas, reapply every 2 hours as needed. Call for new or changing lesions.  Staying in the shade or wearing long sleeves, sun glasses (UVA+UVB protection) and wide brim hats (4-inch brim around the entire circumference of the hat) are also recommended for sun protection.  [place] [address]     Recommend  Normand Sloop, M.D. [address] [place], [place] [address] [phone]  [email or web address]       Melanoma ABCDEs  Melanoma is the most dangerous type of skin cancer, and is the leading cause of death from skin disease.  You are more likely to develop melanoma if you: Have light-colored skin, light-colored eyes, or red or blond hair Spend a lot of time in the sun Tan regularly, either outdoors or in a tanning bed Have had blistering sunburns, especially during childhood Have a close family member who has had a melanoma Have atypical moles or large birthmarks  Early detection of melanoma is key since treatment is typically straightforward and cure rates are extremely high if we catch it early.   The first sign of melanoma is often a change in a mole or a new dark spot.  The ABCDE system is a way of remembering the signs of melanoma.  A for asymmetry:  The two halves do not match. B for border:  The edges of the growth are irregular. C for color:  A mixture of colors are present instead of an even brown color. D for diameter:  Melanomas are usually (but not always) greater than 6mm - the size of a pencil eraser. E for evolution:  The spot keeps changing in size, shape, and color.  Please check your skin once per month between visits. You can use a small mirror in front and a large mirror behind you to keep an eye on the back side or your body.   If you see any new or changing lesions before your next follow-up, please call to schedule a visit.  Please continue daily skin protection  including broad spectrum sunscreen SPF 30+ to sun-exposed areas, reapplying every 2 hours as needed when you're outdoors.   Staying in the shade or wearing long sleeves, sun glasses (UVA+UVB protection) and wide brim hats (4-inch brim around the entire circumference of the hat) are also recommended for sun protection.      Due to recent changes in healthcare laws, you may see results of your pathology and/or laboratory studies on MyChart before the doctors have had a chance to review them. We understand that in some cases there may be results that are confusing or concerning to you. Please understand that not all results are received at the same time and often the doctors may need to interpret multiple results in order to provide you with the best plan of care or course of treatment. Therefore, we ask that you please give [place] 2 business days to thoroughly review all your results before contacting the office for clarification. Should we see a critical lab result, you will be contacted sooner.   If You Need Anything After Your Visit  If you have any questions or concerns for your doctor, please call our main line at [phone] and press option 4 to reach your doctor's medical assistant. If no one answers, please leave a voicemail as directed and we will return your call as soon as possible. Messages left after 4 pm will be answered the following business day.  You may also send [place] a message via MyChart. We typically respond to MyChart messages within 1-2 business days.  For prescription refills, please ask your pharmacy to contact our office. Our fax number is [phone].  If you have an urgent issue when the clinic is closed that cannot wait until the next business day, you can page your doctor at the number below.    Please note that while we do our best to be available for urgent issues outside of office hours, we are not available 24/7.   If you have an urgent issue and are unable to reach  [place], you may choose to seek medical care at your doctor's office, retail clinic, urgent care center, or emergency room.  If you have a medical emergency, please immediately call 911 or go to the emergency department.  Pager Numbers  - Dr. Gwen Pounds: [phone]  - Dr. Roseanne Reno: [phone]  - Dr. Katrinka Blazing: [phone]   In the event of inclement weather, please call our main line at [phone] for an update on the status of any delays or closures.  Dermatology Medication Tips: Please keep the boxes that topical medications come in in order to help keep track of the instructions about where and how to use these. Pharmacies typically print the medication instructions only on the boxes and not directly on the medication tubes.   If your medication is too expensive, please contact our office at [phone] option 4 or send [place] a message through MyChart.   We are unable to tell what your co-pay for medications will be in advance as this is different depending on your insurance coverage. However, we may be able to find a substitute medication at lower cost or fill out paperwork to get insurance to cover a needed medication.   If a prior authorization is required to get your medication covered by your insurance company, please allow [place] 1-2 business days to complete this process.  Drug prices often vary depending on where the prescription is filled and some pharmacies may offer cheaper prices.  The website www.goodrx.com contains coupons for medications through different pharmacies. The prices here do not account for what the cost may be with help from insurance (it may be cheaper with your insurance), but the website can give you the price if you did not use any insurance.  - You can print the associated coupon and take it with your prescription to the pharmacy.  - You may also stop by our office during regular business hours and pick up a GoodRx coupon card.  - If you need your prescription sent  electronically to a different pharmacy, notify our office through [facility] or by phone at [phone] option 4.     Si Usted Necesita Algo Despus de Su Visita  Tambin puede enviarnos un mensaje a travs de Clinical cytogeneticist. Por lo general respondemos a los mensajes de MyChart en el transcurso de 1 a 2 das hbiles.  Para renovar recetas, por favor pida a su farmacia que se ponga en contacto con nuestra oficina. Annie Sable de fax es [place] [phone].  Si tiene un asunto urgente cuando la clnica est cerrada y que no puede esperar hasta el siguiente da hbil, puede llamar/localizar a su doctor(a) al nmero que aparece a continuacin.   Por favor, tenga en cuenta que aunque hacemos todo lo posible para estar disponibles para asuntos urgentes fuera del horario de [place], no estamos disponibles las 24 horas del da, los 7 [address]  [address] de la [place].  Si tiene un problema urgente y no puede comunicarse con nosotros, puede optar por buscar atencin mdica  en el consultorio de su doctor(a), en una clnica privada, en un centro de atencin urgente o en una sala de emergencias.  Si tiene Engineer, drilling, por favor llame inmediatamente al 911 o vaya a la sala de emergencias.  Nmeros de bper  - Dr. Bary Likes: [phone]  - Dra. Annette Barters: [phone]  - Dr. Felipe Horton: [phone]   En caso de inclemencias del tiempo, por favor llame a Lajuan Pila principal al [phone] para una actualizacin sobre el [place] de cualquier retraso o cierre.  Consejos para la medicacin en dermatologa: Por favor, guarde las cajas en las que vienen los medicamentos de uso tpico para ayudarle a seguir las instrucciones sobre dnde y cmo usarlos. Las farmacias generalmente imprimen las instrucciones del medicamento slo en las cajas y no directamente en los tubos del [place].   Si su medicamento es muy caro, por favor, pngase en contacto con Bettyjane Brunet llamando al [phone] y presione la  opcin 4 o envenos un mensaje a travs de Clinical cytogeneticist.   No podemos decirle cul ser su copago por los medicamentos por adelantado ya que esto es diferente dependiendo de la cobertura de su seguro. Sin embargo, es posible que podamos encontrar un medicamento sustituto a Audiological scientist un formulario para que el seguro cubra el medicamento que se considera necesario.   Si se requiere una autorizacin previa para que su compaa de seguros [place] su medicamento, por favor permtanos de 1 a 2 das hbiles para completar este proceso.  Los precios de los medicamentos varan con frecuencia dependiendo del Environmental consultant de dnde se surte la receta y alguna farmacias pueden ofrecer precios ms baratos.  El sitio web www.goodrx.com tiene cupones para medicamentos de Health and safety inspector. Los precios aqu no tienen en cuenta lo que podra costar con la ayuda del seguro (puede ser ms barato con su seguro), pero el sitio web puede darle el precio si no utiliz Tourist information centre manager.  - Puede imprimir el cupn correspondiente y llevarlo con su receta a la farmacia.  - Tambin puede pasar por nuestra oficina durante el horario de atencin regular y Education officer, museum una tarjeta de cupones de GoodRx.  - Si necesita que su receta se enve electrnicamente a una farmacia diferente, informe a nuestra oficina a travs de MyChart de [place] o por telfono llamando al [phone] y presione la opcin 4.

## 2023-11-14 NOTE — Progress Notes (Unsigned)
 Follow-Up Visit   Subjective  Nicholas Cook is a 33 y.o. male who presents for the following: Skin Cancer Screening and Full Body Skin Exam. Hx of dysplastic nevi. Severe atypia  Has large pore at right cheek. Would like to discuss treatment options.   The patient presents for Total-Body Skin Exam (TBSE) for skin cancer screening and mole check. The patient has spots, moles and lesions to be evaluated, some may be new or changing and the patient may have concern these could be cancer.    The following portions of the chart were reviewed this encounter and updated as appropriate: medications, allergies, medical history  Review of Systems:  No other skin or systemic complaints except as noted in HPI or Assessment and Plan.  Objective  Well appearing patient in no apparent distress; mood and affect are within normal limits.  A full examination was performed including scalp, head, eyes, ears, nose, lips, neck, chest, axillae, abdomen, back, buttocks, bilateral upper extremities, bilateral lower extremities, hands, feet, fingers, toes, fingernails, and toenails. All findings within normal limits unless otherwise noted below.   Relevant physical exam findings are noted in the Assessment and Plan.  Right Malar Cheek Large open pore  Assessment & Plan   SKIN CANCER SCREENING PERFORMED TODAY.  HISTORY OF DYSPLASTIC NEVI. Upper back spinal and right upper paraspinal with severe atypia. 06/28/2021.   No evidence of recurrence today Recommend regular full body skin exams Recommend daily broad spectrum sunscreen SPF 30+ to sun-exposed areas, reapply every 2 hours as needed.  Call if any new or changing lesions are noted between office visits   ACTINIC DAMAGE - Chronic condition, secondary to cumulative UV/sun exposure - diffuse scaly erythematous macules with underlying dyspigmentation - Recommend daily broad spectrum sunscreen SPF 30+ to sun-exposed areas, reapply every 2 hours as  needed.  - Staying in the shade or wearing long sleeves, sun glasses (UVA+UVB protection) and wide brim hats (4-inch brim around the entire circumference of the hat) are also recommended for sun protection.  - Call for new or changing lesions.  LENTIGINES, SEBORRHEIC KERATOSES, HEMANGIOMAS - Benign normal skin lesions - Benign-appearing - Call for any changes  MELANOCYTIC NEVI - Tan-brown and/or pink-flesh-colored symmetric macules and papules - Benign appearing on exam today - Observation - Call clinic for new or changing moles - Recommend daily use of broad spectrum spf 30+ sunscreen to sun-exposed areas.    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  Call clinic for new or changing lesions. Discussed option of intralesional triamcinolone if this becomes bothersome.   Treatment Plan: Observe.   SCAR; secondary to [place] man o' war Exam: erythematous patch at right posterior shoulder Benign-appearing.  Observation.  Call clinic for new or changing lesions. Recommend daily broad spectrum sunscreen SPF 30+, reapply every 2 hours as needed. Treatment: Recommend Serica moisturizing scar formula cream every night or [facility] brand or Mederma silicone scar sheet every night for the first year after a scar appears to help with scar remodeling if desired. Scars remodel on their own for a full year and will gradually improve in appearance over time.  DERMATOFIBROMA Exam: Firm pink/brown papulenodule with dimple sign at left lateral leg below knee, right upper back. Treatment Plan: A dermatofibroma is a benign growth possibly related to trauma, such as an insect bite, cut from shaving, or inflamed acne-type bump.  Treatment options to remove include shave or excision with resulting scar and  risk of recurrence.  Since benign-appearing and not bothersome, will observe for now.   TELANGIECTASIA Exam: dilated blood vessel(s)  at face  Treatment Plan: Benign appearing on exam Call for changes  Counseling for BBL / IPL / Laser and Coordination of Care Discussed the treatment option of Broad Band Light (BBL) /Intense Pulsed Light (IPL)/ Laser for skin discoloration, including brown spots and redness.  Typically we recommend at least 1-3 treatment sessions about 5-8 weeks apart for best results.  Cannot have tanned skin when BBL performed, and regular use of sunscreen/photoprotection is advised after the procedure to help maintain results. The patient's condition may also require "maintenance treatments" in the future.  The fee for BBL / laser treatments is $350 per treatment session for the whole face.  A fee can be quoted for other parts of the body.  Insurance typically does not pay for BBL/laser treatments and therefore the fee is an out-of-pocket cost. Recommend prophylactic valtrex treatment. Once scheduled for procedure, will send Rx in prior to patient's appointment.   ACNE/FOLLICULITIS, secondary to shaving hair on back Exam: Perifollicular erythematous papules and pustules at back  Folliculitis occurs due to inflammation of the superficial hair follicle (pore), resulting in acne-like lesions (pus bumps). It can be infectious (bacterial, fungal) or noninfectious (shaving, tight clothing, heat/sweat, medications).  Folliculitis can be acute or chronic and recommended treatment depends on the underlying cause of folliculitis.  Treatment Plan: Patient deferred treatment at this time.    DILATED PORE OF Festus Hubert OF CHEEK Right Malar Cheek Discussed cosmetic dermatologist consultation.  Recommend  Valaria Garland, M.D. [address], [address] [place], [place] [address] [phone]  [email or web address]  Return in about 1 year (around 11/13/2024) for TBSE, HxDN.  I, Jill Parcell, CMA, am acting as scribe for Harris Liming, MD.   Documentation: I have reviewed the above documentation for accuracy  and completeness, and I agree with the above.  Harris Liming, MD

## 2023-12-10 ENCOUNTER — Encounter: Payer: Managed Care, Other (non HMO) | Admitting: Dermatology

## 2024-11-13 ENCOUNTER — Ambulatory Visit: Admitting: Dermatology
